# Patient Record
Sex: Female | Born: 1935 | Race: White | Hispanic: No | Marital: Married | Smoking: Never smoker
Health system: Southern US, Community
[De-identification: ages and names within clinical notes are randomized; demographics above are authoritative.]

## PROBLEM LIST (undated history)

## (undated) DIAGNOSIS — K219 Gastro-esophageal reflux disease without esophagitis: Secondary | ICD-10-CM

## (undated) DIAGNOSIS — M1990 Unspecified osteoarthritis, unspecified site: Secondary | ICD-10-CM

## (undated) DIAGNOSIS — K635 Polyp of colon: Secondary | ICD-10-CM

## (undated) DIAGNOSIS — K2270 Barrett's esophagus without dysplasia: Secondary | ICD-10-CM

## (undated) DIAGNOSIS — K5792 Diverticulitis of intestine, part unspecified, without perforation or abscess without bleeding: Secondary | ICD-10-CM

## (undated) DIAGNOSIS — N83209 Unspecified ovarian cyst, unspecified side: Secondary | ICD-10-CM

## (undated) DIAGNOSIS — F32A Depression, unspecified: Secondary | ICD-10-CM

## (undated) DIAGNOSIS — K5790 Diverticulosis of intestine, part unspecified, without perforation or abscess without bleeding: Secondary | ICD-10-CM

## (undated) DIAGNOSIS — I272 Pulmonary hypertension, unspecified: Secondary | ICD-10-CM

## (undated) DIAGNOSIS — K760 Fatty (change of) liver, not elsewhere classified: Secondary | ICD-10-CM

## (undated) DIAGNOSIS — E785 Hyperlipidemia, unspecified: Secondary | ICD-10-CM

## (undated) DIAGNOSIS — I10 Essential (primary) hypertension: Secondary | ICD-10-CM

## (undated) DIAGNOSIS — F419 Anxiety disorder, unspecified: Secondary | ICD-10-CM

## (undated) DIAGNOSIS — F329 Major depressive disorder, single episode, unspecified: Secondary | ICD-10-CM

## (undated) DIAGNOSIS — K449 Diaphragmatic hernia without obstruction or gangrene: Secondary | ICD-10-CM

## (undated) HISTORY — DX: Diaphragmatic hernia without obstruction or gangrene: K44.9

## (undated) HISTORY — DX: Fatty (change of) liver, not elsewhere classified: K76.0

## (undated) HISTORY — DX: Unspecified ovarian cyst, unspecified side: N83.209

## (undated) HISTORY — DX: Diverticulitis of intestine, part unspecified, without perforation or abscess without bleeding: K57.92

## (undated) HISTORY — DX: Pulmonary hypertension, unspecified: I27.20

## (undated) HISTORY — DX: Depression, unspecified: F32.A

## (undated) HISTORY — DX: Major depressive disorder, single episode, unspecified: F32.9

## (undated) HISTORY — DX: Barrett's esophagus without dysplasia: K22.70

## (undated) HISTORY — DX: Polyp of colon: K63.5

## (undated) HISTORY — PX: BREAST SURGERY: SHX581

## (undated) HISTORY — DX: Diverticulosis of intestine, part unspecified, without perforation or abscess without bleeding: K57.90

---

## 1988-08-27 HISTORY — PX: CARPAL TUNNEL RELEASE: SHX101

## 1999-02-16 ENCOUNTER — Other Ambulatory Visit: Admission: RE | Admit: 1999-02-16 | Discharge: 1999-02-16 | Payer: Self-pay | Admitting: *Deleted

## 2000-07-23 ENCOUNTER — Other Ambulatory Visit: Admission: RE | Admit: 2000-07-23 | Discharge: 2000-07-23 | Payer: Self-pay | Admitting: *Deleted

## 2001-11-19 ENCOUNTER — Other Ambulatory Visit: Admission: RE | Admit: 2001-11-19 | Discharge: 2001-11-19 | Payer: Self-pay | Admitting: *Deleted

## 2007-01-30 ENCOUNTER — Other Ambulatory Visit: Admission: RE | Admit: 2007-01-30 | Discharge: 2007-01-30 | Payer: Self-pay | Admitting: *Deleted

## 2008-12-31 ENCOUNTER — Encounter: Admission: RE | Admit: 2008-12-31 | Discharge: 2008-12-31 | Payer: Self-pay | Admitting: Family Medicine

## 2009-01-27 ENCOUNTER — Encounter: Admission: RE | Admit: 2009-01-27 | Discharge: 2009-01-27 | Payer: Self-pay | Admitting: Chiropractic Medicine

## 2010-03-21 ENCOUNTER — Encounter: Admission: RE | Admit: 2010-03-21 | Discharge: 2010-03-21 | Payer: Self-pay | Admitting: Orthopedic Surgery

## 2010-03-23 ENCOUNTER — Ambulatory Visit (HOSPITAL_BASED_OUTPATIENT_CLINIC_OR_DEPARTMENT_OTHER): Admission: RE | Admit: 2010-03-23 | Discharge: 2010-03-23 | Payer: Self-pay | Admitting: Orthopedic Surgery

## 2010-06-16 ENCOUNTER — Encounter: Payer: Self-pay | Admitting: Pulmonary Disease

## 2010-07-12 ENCOUNTER — Encounter: Payer: Self-pay | Admitting: Pulmonary Disease

## 2010-07-28 DIAGNOSIS — I10 Essential (primary) hypertension: Secondary | ICD-10-CM | POA: Insufficient documentation

## 2010-07-28 DIAGNOSIS — K219 Gastro-esophageal reflux disease without esophagitis: Secondary | ICD-10-CM | POA: Insufficient documentation

## 2010-07-28 DIAGNOSIS — E785 Hyperlipidemia, unspecified: Secondary | ICD-10-CM | POA: Insufficient documentation

## 2010-07-28 DIAGNOSIS — I517 Cardiomegaly: Secondary | ICD-10-CM | POA: Insufficient documentation

## 2010-07-28 DIAGNOSIS — F329 Major depressive disorder, single episode, unspecified: Secondary | ICD-10-CM | POA: Insufficient documentation

## 2010-07-31 ENCOUNTER — Ambulatory Visit: Payer: Self-pay | Admitting: Pulmonary Disease

## 2010-07-31 DIAGNOSIS — I2789 Other specified pulmonary heart diseases: Secondary | ICD-10-CM | POA: Insufficient documentation

## 2010-09-14 ENCOUNTER — Other Ambulatory Visit: Payer: Self-pay | Admitting: Internal Medicine

## 2010-09-14 ENCOUNTER — Ambulatory Visit
Admission: RE | Admit: 2010-09-14 | Discharge: 2010-09-14 | Payer: Self-pay | Source: Home / Self Care | Attending: Internal Medicine | Admitting: Internal Medicine

## 2010-09-14 ENCOUNTER — Encounter: Payer: Self-pay | Admitting: Internal Medicine

## 2010-09-15 LAB — BASIC METABOLIC PANEL
BUN: 23 mg/dL (ref 6–23)
CO2: 28 mEq/L (ref 19–32)
Calcium: 9.2 mg/dL (ref 8.4–10.5)
Chloride: 103 mEq/L (ref 96–112)
Creatinine, Ser: 0.9 mg/dL (ref 0.4–1.2)
GFR: 65.65 mL/min (ref >60.00)
Glucose, Bld: 94 mg/dL (ref 70–99)
Potassium: 3.7 mEq/L (ref 3.5–5.1)
Sodium: 140 mEq/L (ref 135–145)

## 2010-09-15 LAB — CBC WITH DIFFERENTIAL/PLATELET
Basophils Absolute: 0 10*3/uL (ref 0.0–0.1)
Basophils Relative: 0.4 % (ref 0.0–3.0)
Eosinophils Absolute: 0.1 10*3/uL (ref 0.0–0.7)
Eosinophils Relative: 2 % (ref 0.0–5.0)
HCT: 38.5 % (ref 36.0–46.0)
Hemoglobin: 13.5 g/dL (ref 12.0–15.0)
Lymphocytes Relative: 41.1 % (ref 12.0–46.0)
Lymphs Abs: 2.8 10*3/uL (ref 0.7–4.0)
MCHC: 35 g/dL (ref 30.0–36.0)
MCV: 90 fl (ref 78.0–100.0)
Monocytes Absolute: 0.5 10*3/uL (ref 0.1–1.0)
Monocytes Relative: 6.8 % (ref 3.0–12.0)
Neutro Abs: 3.3 10*3/uL (ref 1.4–7.7)
Neutrophils Relative %: 49.7 % (ref 43.0–77.0)
Platelets: 212 10*3/uL (ref 150.0–400.0)
RBC: 4.28 Mil/uL (ref 3.87–5.11)
RDW: 13.3 % (ref 11.5–14.6)
WBC: 6.7 10*3/uL (ref 4.5–10.5)

## 2010-09-15 LAB — PROTIME-INR
INR: 1.1 ratio — ABNORMAL HIGH (ref 0.8–1.0)
Prothrombin Time: 12.2 s (ref 10.2–12.4)

## 2010-09-22 ENCOUNTER — Ambulatory Visit
Admission: RE | Admit: 2010-09-22 | Payer: Self-pay | Source: Home / Self Care | Attending: Internal Medicine | Admitting: Internal Medicine

## 2010-09-22 LAB — POCT I-STAT 3, VENOUS BLOOD GAS (G3P V)
Acid-Base Excess: 2 mmol/L (ref 0.0–2.0)
Bicarbonate: 22.9 mEq/L (ref 20.0–24.0)
Bicarbonate: 25.7 mEq/L — ABNORMAL HIGH (ref 20.0–24.0)
Bicarbonate: 27.9 mEq/L — ABNORMAL HIGH (ref 20.0–24.0)
O2 Saturation: 63 %
O2 Saturation: 84 %
TCO2: 24 mmol/L (ref 0–100)
TCO2: 27 mmol/L (ref 0–100)
TCO2: 29 mmol/L (ref 0–100)
pCO2, Ven: 37 mmHg — ABNORMAL LOW (ref 45.0–50.0)
pCO2, Ven: 46.3 mmHg (ref 45.0–50.0)
pH, Ven: 7.351 — ABNORMAL HIGH (ref 7.250–7.300)
pH, Ven: 7.362 — ABNORMAL HIGH (ref 7.250–7.300)
pO2, Ven: 35 mmHg (ref 30.0–45.0)

## 2010-09-22 LAB — POCT I-STAT 3, ART BLOOD GAS (G3+)
O2 Saturation: 97 %
TCO2: 29 mmol/L (ref 0–100)
pCO2 arterial: 39.1 mmHg (ref 35.0–45.0)
pH, Arterial: 7.456 — ABNORMAL HIGH (ref 7.350–7.400)
pO2, Arterial: 90 mmHg (ref 80.0–100.0)

## 2010-09-28 NOTE — Assessment & Plan Note (Signed)
Summary: np6/pulm htn-mb   Visit Type:  Initial Consult Referring Provider:  Adrian Prince MD Primary Provider:  Adrian Prince MD  CC:  no complaints.  History of Present Illness: Sandy [name] is a 77y/o female with a h/o obesity, HTN, depression and HL referrred by Dr. Shelle Iron for right heart cath due to pulmonary HTN found by ECHO.   She recently transferred her care to Dr. Evlyn Kanner and underwent echo in Oct of 2011. Per the report her LV function was normal at 65 with mild LVH. "Mild" diastolic dysfunction. LA 3.9 . RV normal size and fx. Normal RA. Mod TR with pRSVP consistent with mild to moderate pHTN.  Saw Dr. Thornton Dales recently who did not see any clear etiology for pHTN and felt it may be primarily L-sided. Referred here for consideration for RHC.   She feels as if she is doing fairly well.  Very active 19 grandkids. She denies exertional dyspnea. Says she can walk multiple blocks at a moderate pace and go up steps without getting sob.  She denies any signifcant LE edema unless she stands on her feet all day.  She has no known cardiac issues except a h/o rheumatic fever at age 59. Never had a stress test.  No h/o thrombo-embolic disease.  No family h/o PAH. She has no symptoms of autoimmune disease except arthritic changes in her hands.  Has never use anorexogens. She does snore, but denies other symptoms c/w sleep disordered breathing.  She has never smoked.  She has had a recent cxr with no acute process. No syncope.   Preventive Screening-Counseling & Management  Caffeine-Diet-Exercise     Does Patient Exercise: no      Drug Use:  no.    Problems Prior to Update: 1)  Hypertension, Pulmonary  (ICD-416.8) 2)  G E R D  (ICD-530.81) 3)  Ventricular Hypertrophy, Left  (ICD-429.3) 4)  Hypertension  (ICD-401.9) 5)  Hyperlipidemia  (ICD-272.4) 6)  Depression  (ICD-311)  Medications Prior to Update: 1)  Lexapro 10 Mg Tabs (Escitalopram Oxalate) .... Once Daily 2)   Hydrochlorothiazide 25 Mg Tabs (Hydrochlorothiazide) .... Once Daily 3)  Klonopin 0.5 Mg Tabs (Clonazepam) .Marland Kitchen.. 1 Two Times A Day 4)  Pravastatin Sodium 20 Mg Tabs (Pravastatin Sodium) .... Once Daily  Current Medications (verified): 1)  Lexapro 10 Mg Tabs (Escitalopram Oxalate) .... Once Daily 2)  Hydrochlorothiazide 25 Mg Tabs (Hydrochlorothiazide) .... Once Daily 3)  Klonopin 0.5 Mg Tabs (Clonazepam) .Marland Kitchen.. 1 Two Times A Day 4)  Pravastatin Sodium 20 Mg Tabs (Pravastatin Sodium) .... Once Daily  Allergies (verified): No Known Drug Allergies  Past History:  Past Medical History: Last updated: 09/13/2010 1. h/o rheumatic fever age 31 2. HTN 3. Hyperlipidemia 4. Depression  5. G E R D with hiatal hernia on cxr. 6. 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  Alcohol Use - no Regular Exercise - no Drug Use - no  Risk Factors: Exercise: no (09/14/2010)  Risk Factors: Smoking Status: never (07/31/2010)  Family History: Reviewed history from 07/31/2010 and no changes required. CAD--father liver cancer--mother, mgf  Social History: Reviewed history from 07/31/2010 and no changes required. 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  Alcohol Use - no Regular Exercise - no Drug Use - no Does Patient Exercise:  no Drug Use:  no  Review  of Systems       As per HPI and past medical history; otherwise all systems negative.   Vital Signs:  Patient profile:   77 year old female Height:      65 inches Weight:      171 pounds BMI:     28.56 Pulse rate:   78 / minute BP sitting:   142 / 70  (left arm) Cuff size:   regular  Vitals Entered By: Hardin Negus, RMA (September 14, 2010 2:48 PM)  Physical Exam  General:  Gen:  well appearing. no resp difficulty HEENT: normal Neck: supple. no JVD. Carotids 2+ bilat; no bruits. No lymphadenopathy or thryomegaly appreciated. Cor: PMI nondisplaced. Regular rate & rhythm. No rubs, gallops. 2/6 TR Lungs: clear Abdomen: soft, nontender, nondistended. No hepatosplenomegaly. No bruits or masses. Good bowel sounds. Extremities: no cyanosis, clubbing, rash, edema Neuro: alert & orientedx3, cranial nerves grossly intact. moves all 4 extremities w/o difficulty. affect pleasant    Impression & Recommendations:  Problem # 1:  HYPERTENSION, PULMONARY (ICD-416.8) I agree with Dr. Shelle Iron that this is likely pulmonary venous HTN (due to diastolic dysfunction)and not primary PAH. Extensive discussion with her and her husband about mechanisms of pHTN and difference between L and R sided pHTN as well as the fact that echo esitmates of pulmonary pressures can be inaccurate. We also discussed risks andindiactions of RHC. They would like to proceed. WIll plan RHC next week.   Other Orders: EKG w/ Interpretation (93000) Cardiac Catheterization (Cardiac Cath) TLB-BMP (Basic Metabolic Panel-BMET) (80048-METABOL) TLB-CBC Platelet - w/Differential (85025-CBCD) TLB-PT (Protime) (85610-PTP)  Patient Instructions: 1)  Labs today 2)  You are scheduled for a cath on Fri 1/27 please see instruction sheet.

## 2010-09-28 NOTE — Assessment & Plan Note (Signed)
Summary: consult for possible pulmonary htn.   Visit Type:  Initial Consult Copy to:  Adrian Prince MD Primary Provider/Referring Provider:  Adrian Prince MD  CC:  pulmonary consult. pt is being evaluated for pulmonary htn. .  History of Present Illness: The pt is a 77y/o female who I have been asked to see for pulmonary htn.  She has had a recent echo that showed LVH with probable diastolic dysfunction, nl EF, and RV pressure estimated at 53mm.  The pt currently does not feel she has a breathing issue.  She denies sob with housework, bringing groceries in from the car, and feels she can walk multiple blocks at a moderate pace without getting sob.  She denies any LE edema unless she stands on her feet all day.  She has no known cardiac issues except a h/o rheumatic fever at age 27.  She has no symptoms of autoimmune disease except arthritic changes in her hands.  She does snore, but denies other symptoms c/w sleep disordered breathing.  She has never smoked.  She has had a recent cxr with no acute process.  Preventive Screening-Counseling & Management  Alcohol-Tobacco     Smoking Status: never  Current Medications (verified): 1)  Lexapro 10 Mg Tabs (Escitalopram Oxalate) .... Once Daily 2)  Hydrochlorothiazide 25 Mg Tabs (Hydrochlorothiazide) .... Once Daily 3)  Klonopin 0.5 Mg Tabs (Clonazepam) .Marland Kitchen.. 1 Two Times A Day 4)  Pravastatin Sodium 20 Mg Tabs (Pravastatin Sodium) .... Once Daily  Allergies (verified): No Known Drug Allergies  Past History:  Past Medical History:  h/o rheumatic fever age 31 HYPERTENSION (ICD-401.9) HYPERLIPIDEMIA (ICD-272.4) DEPRESSION (ICD-311)  G E R D with hiatal hernia on cxr.  Past Surgical History: Reviewed history from 07/28/2010 and no changes required. release of right transverse carpal ligament breast cysts  Family History: Reviewed history from 07/28/2010 and no changes required. 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  Smoking Status:  never  Review of Systems       The patient complains of acid heartburn.  The patient denies shortness of breath with activity, shortness of breath at rest, productive cough, non-productive cough, coughing up blood, chest pain, irregular heartbeats, indigestion, loss of appetite, weight change, abdominal pain, difficulty swallowing, sore throat, tooth/dental problems, headaches, nasal congestion/difficulty breathing through nose, sneezing, itching, ear ache, anxiety, depression, hand/feet swelling, joint stiffness or pain, rash, change in color of mucus, and fever.    Vital Signs:  Patient profile:   77 year old female Height:      65 inches Weight:      175.13 pounds BMI:     29.25 O2 Sat:      95 % on Room air Temp:     98.3 degrees F oral Pulse rate:   80 / minute BP sitting:   132 / 76  (left arm) Cuff size:   large  Vitals Entered By: Carver Fila (July 31, 2010 2:04 PM)  O2 Flow:  Room air CC: pulmonary consult. pt is being evaluated for pulmonary htn.  Comments meds and allergies updated Phone number updated Carver Fila  July 31, 2010 2:04 PM    Physical Exam  General:  obese female in nad Eyes:  PERRLA and EOMI.   Nose:  patent without discharge. Mouth:  clear with no exudates. no significant enlargement of uvula and palate. Neck:  no jvd, tmg, LN Lungs:  clear to auscultation  Heart:  rrr, 3/6 sem Abdomen:  soft and nontender, bs+ Extremities:  no edema or cyanosis  pulses intact distally Neurologic:  alert and oriented, moves all 4.   Impression & Recommendations:  Problem # 1:  HYPERTENSION, PULMONARY (ICD-416.8) Assessment New the pt has been found to have moderate pulmonary htn by estimated pressures on echo.  She does not have any significant history for doe, worsening LE edema, or autoimmune disease.  It is unclear whether this is  accurate, and therefore I have recommended a right heart cath to measure pressures and to pinpoint where the problem may be in the cardiopulmonary circuit.  If she has diastolic dysfunction, may benefit from a change in meds for her hypertension.  If she truly does have pulmonary arterial htn, she will need a complete workup to find the etiology.  The pt is agreeable to this approach  Medications Added to Medication List This Visit: 1)  Lexapro 10 Mg Tabs (Escitalopram oxalate) .... Once daily 2)  Hydrochlorothiazide 25 Mg Tabs (Hydrochlorothiazide) .... Once daily 3)  Klonopin 0.5 Mg Tabs (Clonazepam) .Marland Kitchen.. 1 two times a day 4)  Pravastatin Sodium 20 Mg Tabs (Pravastatin sodium) .... Once daily  Other Orders: Consultation Level IV (16109) Cardiology Referral (Cardiology)  Patient Instructions: 1)  will refer you to Dr. Gala Romney for right heart cath after the holidays. 2)  will arrange followup once the results are available.   Immunization History:  Influenza Immunization History:    Influenza:  historical (05/27/2010)  Pneumovax Immunization History:    Pneumovax:  historical (05/27/2000)   Appended Document: consult for possible pulmonary htn. please check and make sure this pt got her cardiology apptm...thanks.

## 2010-09-28 NOTE — Letter (Signed)
Summary: Cardiac Catheterization Instructions- [facility]  [facility], Main Office  [address]   [place], [place] [address]   Phone: [phone]  Fax: [phone]     09/14/2010 MRN: [id number]  [facility] [address] [place], [place]  [address]  Dear Ms. COYNE,   You are scheduled for a Cardiac Catheterization on Friday 09/22/10 with Dr. Gala Romney  Please arrive to the 1st floor of the Heart and Vascular Center at [facility] at 8:30 am / pm on the day of your procedure. Please do not arrive before 6:30 a.m. Call the Heart and Vascular Center at [phone] if you are unable to make your appointmnet. The Code to get into the parking garage under the building is 0030. Take the elevators to the 1st floor. You must have someone to drive you home. Someone must be with you for the first 24 hours after you arrive home. Please wear clothes that are easy to get on and off and wear slip-on shoes. Do not eat or drink after midnight except water with your medications that morning. Bring all your medications and current insurance cards with you.  _X__ DO NOT take these medications before your procedure: ________Hydrochlorothiazide________________________________________________________  ___ Make sure you take your aspirin.  ___ You may take ALL of your medications with water that morning. ________________________________________________________________________________________________________________________________  ___ DO NOT take ANY medications before your procedure.  ___ Pre-med instructions:  ________________________________________________________________________________________________________________________________  The usual length of stay after your procedure is 2 to 3 hours. This can vary.  If you have any questions, please call the office at the number listed above.   Meredith Staggers, RN

## 2010-10-06 ENCOUNTER — Encounter: Payer: Self-pay | Admitting: Physician Assistant

## 2010-10-06 ENCOUNTER — Encounter (INDEPENDENT_AMBULATORY_CARE_PROVIDER_SITE_OTHER): Payer: Medicare Other | Admitting: Physician Assistant

## 2010-10-06 DIAGNOSIS — I279 Pulmonary heart disease, unspecified: Secondary | ICD-10-CM

## 2010-10-12 NOTE — Assessment & Plan Note (Addendum)
Summary: 2 week eph/kim cath lab/mt   Referring Provider:  Adrian Prince MD Primary Provider:  Adrian Prince MD   History of Present Illness: Primary Cardiologist:  Dr. Arvilla Meres  Sandy [name] is a 77 yo female with a h/o HLP and HTN who recently had pulmonary HTN demonstrated on an echo.  She saw Dr. Gala Romney  and was set up for right heart cath.  Her On January 27 demonstrated minimally elevated right-sided pressures.  Her PCWP mean was 12, RA mean 6, RV 41/5, EDP 9, PAP 34/12 with a mean of 21; CO 3.7, CI 2.0.  She presents for post followup.  She is doing well.  She denies any groin pain.  She denies chest pain, shortness of breath or syncope.  Current Medications (verified): 1)  Lexapro 10 Mg Tabs (Escitalopram Oxalate) .... Once Daily 2)  Hydrochlorothiazide 25 Mg Tabs (Hydrochlorothiazide) .... Once Daily 3)  Klonopin 0.5 Mg Tabs (Clonazepam) .Marland Kitchen.. 1 Two Times A Day 4)  Pravastatin Sodium 20 Mg Tabs (Pravastatin Sodium) .... Once Daily  Allergies (verified): No Known Drug Allergies  Past History:  Past Medical History: Last updated: 09/13/2010 1. h/o rheumatic fever age 82 2. HTN 3. Hyperlipidemia 4. Depression  5. G E R D with hiatal hernia on cxr. 6. Pulmonary HTN  Vital Signs:  Patient profile:   77 year old female Height:      65 inches Weight:      170 pounds BMI:     28.39 Pulse rate:   78 / minute Resp:     16 per minute BP sitting:   128 / 78  (left arm)  Vitals Entered By: Marrion Coy, CNA (October 06, 2010 10:19 AM)  Physical Exam  General:  Well nourished, well developed, in no acute distress HEENT: normal Neck: no JVD Cardiac:  normal S1, S2; RRR; no murmur Lungs:  clear to auscultation bilaterally, no wheezing, rhonchi or rales Abd: soft, nontender, no hepatomegaly Ext: no  edema; RFV site without hematoma or bruit Skin: warm and dry Neuro:  CNs 2-12 intact, no focal abnormalities noted    Impression & Recommendations:  Problem  # 1:  HYPERTENSION, PULMONARY (ICD-416.8) As noted her right-sided pressures were minimally elevated.  This could be early sleep apnea or possibly transient diastolic dysfunction.  She's been asked to watch her salt.  If she presents with more signs and symptoms of sleep apnea, this can be worked up.  She can followup with Dr. Gala Romney as needed.  Patient Instructions: 1)  Your physician recommends that you schedule a follow-up appointment in: as needed

## 2010-10-18 NOTE — Procedures (Signed)
  Sandy [name]               ACCOUNT NO.:  [id number]  MEDICAL RECORD NO.:  [id number]           PATIENT TYPE:  LOCATION:                                 FACILITY:  PHYSICIAN:  Sandy [name], MDDATE OF BIRTH:  May 17, 1933  DATE OF PROCEDURE: DATE OF DISCHARGE:                           CARDIAC CATHETERIZATION   PRIMARY CARE PHYSICIAN:  Sandy Senior A. Evlyn Kanner, MD  PULMONOLOGIST:  Sandy Share, MD, [facility]  PATIENT IDENTIFICATION:  [name] is a delightful 77 year old woman with a history of obesity, hypertension, and hyperlipidemia.  She was found to have elevated right-sided pressures on a routine echocardiogram with estimated right ventricular systolic pressure of 53 mmHg.  She is essentially asymptomatic; however, given her abnormal echocardiogram, we decided that it is prudent to bring her in for right heart cath and further evaluate for pulmonary hypertension and the need for therapy.  DESCRIPTION OF THE PROCEDURE:  The risks and indications were explained. Consent was signed and placed in the chart.  The right groin area was prepped and draped in routine sterile fashion, and a 7-French venous sheath was placed using a modified Seldinger technique.  A standard Swan- Ganz catheter was used.  There were no apparent complications.  We did use 0.025 wire to help guide the catheter into the SVC to rule out any evidence of intracardiac shunting.  FINDINGS:  Right atrial pressure mean of 6, RV pressure 41/5 with an EDP of 9.  Pulmonary artery pressure 34/12 with a mean of 21.  Pulmonary capillary wedge pressure mean of 12.  There were no significant V-waves. Fick cardiac output 3.7.  Cardiac index 2.0.  Pulmonary vascular resistance was 3.8 Woods units.  Femoral artery saturation 97% on room air.  Pulmonary artery saturation 63% on room air.  Superior vena cava saturation 63%.  ASSESSMENT: 1. Minimally elevated right-sided pressures. 2. No evidence of intracardiac  shunt.  PLAN/DISCUSSION:  [name] has just minimally elevated right-sided pressures of unclear etiology.  This may be related to her body habitus or perhaps early sleep apnea.  I also suspect that she occasionally may bump her pulmonary pressures when she has left-sided volume overload in the setting of diastolic dysfunction.  At this point, I would not recommend any specific therapy.  Keep an eye on her volume status and can add a diuretic as needed though she currently does not appear to need this with a wedge pressure of 12. Consider workup for sleep apnea if clinically indicated.  We would consider possible repeat echocardiogram in 6 months to 1 year just to make sure the pressures are not increasing and there is no dilation of her right-sided structures.     Sandy Buckles. Sandy Thum, MD     DRB/MEDQ  D:  09/22/2010  T:  09/23/2010  Job:  [id number]  cc:   Sandy [name], M.D. Sandy Share, MD,FCCP  Electronically Signed by Sandy Meres MD on 10/18/2010 01:54:03 PM

## 2010-11-11 LAB — BASIC METABOLIC PANEL
BUN: 12 mg/dL (ref 6–23)
Calcium: 9 mg/dL (ref 8.4–10.5)
Creatinine, Ser: 0.95 mg/dL (ref 0.4–1.2)
GFR calc non Af Amer: 58 mL/min — ABNORMAL LOW (ref >60)
Glucose, Bld: 112 mg/dL — ABNORMAL HIGH (ref 70–99)
Potassium: 3.4 mEq/L — ABNORMAL LOW (ref 3.5–5.1)

## 2010-11-11 LAB — POCT HEMOGLOBIN-HEMACUE: Hemoglobin: 13.3 g/dL (ref 12.0–15.0)

## 2013-12-19 ENCOUNTER — Emergency Department (HOSPITAL_COMMUNITY)
Admission: EM | Admit: 2013-12-19 | Discharge: 2013-12-19 | Disposition: A | Discharge status: Home / Self Care | Payer: Medicare Other | Attending: Emergency Medicine | Admitting: Emergency Medicine

## 2013-12-19 ENCOUNTER — Emergency Department (HOSPITAL_COMMUNITY): Payer: Medicare Other

## 2013-12-19 ENCOUNTER — Encounter (HOSPITAL_COMMUNITY): Payer: Self-pay | Admitting: Emergency Medicine

## 2013-12-19 DIAGNOSIS — Z79899 Other long term (current) drug therapy: Secondary | ICD-10-CM | POA: Insufficient documentation

## 2013-12-19 DIAGNOSIS — I10 Essential (primary) hypertension: Secondary | ICD-10-CM | POA: Insufficient documentation

## 2013-12-19 DIAGNOSIS — K5732 Diverticulitis of large intestine without perforation or abscess without bleeding: Secondary | ICD-10-CM | POA: Insufficient documentation

## 2013-12-19 DIAGNOSIS — K5792 Diverticulitis of intestine, part unspecified, without perforation or abscess without bleeding: Secondary | ICD-10-CM

## 2013-12-19 DIAGNOSIS — E785 Hyperlipidemia, unspecified: Secondary | ICD-10-CM | POA: Insufficient documentation

## 2013-12-19 HISTORY — DX: Hyperlipidemia, unspecified: E78.5

## 2013-12-19 HISTORY — DX: Essential (primary) hypertension: I10

## 2013-12-19 LAB — URINALYSIS, ROUTINE W REFLEX MICROSCOPIC
BILIRUBIN URINE: NEGATIVE
Glucose, UA: NEGATIVE mg/dL
Hgb urine dipstick: NEGATIVE
KETONES UR: NEGATIVE mg/dL
LEUKOCYTES UA: NEGATIVE
NITRITE: NEGATIVE
PH: 6.5 (ref 5.0–8.0)
Protein, ur: NEGATIVE mg/dL
SPECIFIC GRAVITY, URINE: 1.026 (ref 1.005–1.030)
UROBILINOGEN UA: 1 mg/dL (ref 0.0–1.0)

## 2013-12-19 LAB — CBC WITH DIFFERENTIAL/PLATELET
BASOS ABS: 0 10*3/uL (ref 0.0–0.1)
BASOS PCT: 0 % (ref 0–1)
EOS ABS: 0 10*3/uL (ref 0.0–0.7)
Eosinophils Relative: 1 % (ref 0–5)
HEMATOCRIT: 37 % (ref 36.0–46.0)
Hemoglobin: 12.2 g/dL (ref 12.0–15.0)
Lymphocytes Relative: 19 % (ref 12–46)
Lymphs Abs: 1.5 10*3/uL (ref 0.7–4.0)
MCH: 29.6 pg (ref 26.0–34.0)
MCHC: 33 g/dL (ref 30.0–36.0)
MCV: 89.8 fL (ref 78.0–100.0)
MONOS PCT: 10 % (ref 3–12)
Monocytes Absolute: 0.8 10*3/uL (ref 0.1–1.0)
Neutro Abs: 5.7 10*3/uL (ref 1.7–7.7)
Neutrophils Relative %: 71 % (ref 43–77)
Platelets: 190 10*3/uL (ref 150–400)
RBC: 4.12 MIL/uL (ref 3.87–5.11)
RDW: 12.8 % (ref 11.5–15.5)
WBC: 8 10*3/uL (ref 4.0–10.5)

## 2013-12-19 LAB — BASIC METABOLIC PANEL
BUN: 13 mg/dL (ref 6–23)
CHLORIDE: 100 meq/L (ref 96–112)
CO2: 25 mEq/L (ref 19–32)
Calcium: 9.2 mg/dL (ref 8.4–10.5)
Creatinine, Ser: 0.81 mg/dL (ref 0.50–1.10)
GFR, EST AFRICAN AMERICAN: 79 mL/min — AB (ref >90)
GFR, EST NON AFRICAN AMERICAN: 68 mL/min — AB (ref >90)
Glucose, Bld: 126 mg/dL — ABNORMAL HIGH (ref 70–99)
POTASSIUM: 3.8 meq/L (ref 3.7–5.3)
Sodium: 137 mEq/L (ref 137–147)

## 2013-12-19 MED ORDER — METRONIDAZOLE 500 MG PO TABS
500.0000 mg | ORAL_TABLET | Freq: Once | ORAL | Status: AC
  Administered 2013-12-19: 500 mg via ORAL
  Filled 2013-12-19: qty 1

## 2013-12-19 MED ORDER — METRONIDAZOLE 500 MG PO TABS: 500.0000 mg | ORAL_TABLET | Freq: Two times a day (BID) | ORAL | Status: DC

## 2013-12-19 MED ORDER — CIPROFLOXACIN HCL 500 MG PO TABS: 500.0000 mg | ORAL_TABLET | Freq: Two times a day (BID) | ORAL | Status: DC

## 2013-12-19 MED ORDER — IOHEXOL 300 MG/ML  SOLN
50.0000 mL | Freq: Once | INTRAMUSCULAR | Status: AC | PRN
  Administered 2013-12-19: 50 mL via ORAL

## 2013-12-19 MED ORDER — IOHEXOL 300 MG/ML  SOLN
100.0000 mL | Freq: Once | INTRAMUSCULAR | Status: AC | PRN
  Administered 2013-12-19: 100 mL via INTRAVENOUS

## 2013-12-19 MED ORDER — OXYCODONE-ACETAMINOPHEN 5-325 MG PO TABS: 1.0000 | ORAL_TABLET | ORAL | Status: DC | PRN

## 2013-12-19 MED ORDER — CIPROFLOXACIN HCL 500 MG PO TABS
500.0000 mg | ORAL_TABLET | Freq: Once | ORAL | Status: AC
  Administered 2013-12-19: 500 mg via ORAL
  Filled 2013-12-19: qty 1

## 2013-12-19 MED ORDER — MORPHINE SULFATE 4 MG/ML IJ SOLN
4.0000 mg | Freq: Once | INTRAMUSCULAR | Status: AC
  Administered 2013-12-19: 4 mg via INTRAVENOUS
  Filled 2013-12-19: qty 1

## 2013-12-19 NOTE — ED Notes (Signed)
Patient transported to CT 

## 2013-12-19 NOTE — Discharge Instructions (Signed)
Diverticulitis °A diverticulum is a small pouch or sac on the colon. Diverticulosis is the presence of these diverticula on the colon. Diverticulitis is the irritation (inflammation) or infection of diverticula. °CAUSES  °The colon and its diverticula contain bacteria. If food particles block the tiny opening to a diverticulum, the bacteria inside can grow and cause an increase in pressure. This leads to infection and inflammation and is called diverticulitis. °SYMPTOMS  °· Abdominal pain and tenderness. Usually, the pain is located on the left side of your abdomen. However, it could be located elsewhere. °· Fever. °· Bloating. °· Feeling sick to your stomach (nausea). °· Throwing up (vomiting). °· Abnormal stools. °DIAGNOSIS  °Your caregiver will take a history and perform a physical exam. Since many things can cause abdominal pain, other tests may be necessary. Tests may include: °· Blood tests. °· Urine tests. °· X-ray of the abdomen. °· CT scan of the abdomen. °Sometimes, surgery is needed to determine if diverticulitis or other conditions are causing your symptoms. °TREATMENT  °Most of the time, you can be treated without surgery. Treatment includes: °· Resting the bowels by only having liquids for a few days. As you improve, you will need to eat a low-fiber diet. °· Intravenous (IV) fluids if you are losing body fluids (dehydrated). °· Antibiotic medicines that treat infections may be given. °· Pain and nausea medicine, if needed. °· Surgery if the inflamed diverticulum has burst. °HOME CARE INSTRUCTIONS  °· Try a clear liquid diet (broth, tea, or water for as long as directed by your caregiver). You may then gradually begin a low-fiber diet as tolerated.  °A low-fiber diet is a diet with less than 10 grams of fiber. Choose the foods below to reduce fiber in the diet: °· White breads, cereals, rice, and pasta. °· Cooked fruits and vegetables or soft fresh fruits and vegetables without the skin. °· Ground or  well-cooked tender beef, ham, veal, lamb, pork, or poultry. °· Eggs and seafood. °· After your diverticulitis symptoms have improved, your caregiver may put you on a high-fiber diet. A high-fiber diet includes 14 grams of fiber for every 1000 calories consumed. For a standard 2000 calorie diet, you would need 28 grams of fiber. Follow these diet guidelines to help you increase the fiber in your diet. It is important to slowly increase the amount fiber in your diet to avoid gas, constipation, and bloating. °· Choose whole-grain breads, cereals, pasta, and brown rice. °· Choose fresh fruits and vegetables with the skin on. Do not overcook vegetables because the more vegetables are cooked, the more fiber is lost. °· Choose more nuts, seeds, legumes, dried peas, beans, and lentils. °· Look for food products that have greater than 3 grams of fiber per serving on the Nutrition Facts label. °· Take all medicine as directed by your caregiver. °· If your caregiver has given you a follow-up appointment, it is very important that you go. Not going could result in lasting (chronic) or permanent injury, pain, and disability. If there is any problem keeping the appointment, call to reschedule. °SEEK MEDICAL CARE IF:  °· Your pain does not improve. °· You have a hard time advancing your diet beyond clear liquids. °· Your bowel movements do not return to normal. °SEEK IMMEDIATE MEDICAL CARE IF:  °· Your pain becomes worse. °· You have an oral temperature above 102° F (38.9° C), not controlled by medicine. °· You have repeated vomiting. °· You have bloody or black, tarry stools. °·   Symptoms that brought you to your caregiver become worse or are not getting better. °MAKE SURE YOU:  °· Understand these instructions. °· Will watch your condition. °· Will get help right away if you are not doing well or get worse. °Document Released: 05/23/2005 Document Revised: 11/05/2011 Document Reviewed: 09/18/2010 °[employer]® Patient Information  ©2014 [employer]. ° °

## 2013-12-19 NOTE — ED Provider Notes (Signed)
CSN: [id number]     Arrival date & time 12/19/13  2992 History   First MD Initiated Contact with Patient 12/19/13 1008     Chief Complaint  Patient presents with  . Abdominal Pain      The history is provided by the patient.   patient reports worsening left lower quadrant pain since yesterday.  She denies urinary symptoms.  No nausea or vomiting.  No diarrhea.  No prior history of similar symptoms.  No history of diverticulitis.  She 80 years old and has never had a colonoscopy.  She sees a physician regularly.  No flank pain.  Her pain is constant.  Her pain is mild to moderate in severity.  Past Medical History  Diagnosis Date  . Hypertension   . Hyperlipidemia    Past Surgical History  Procedure Laterality Date  . Breast surgery      for benign tumor   No family history on file. History  Substance Use Topics  . Smoking status: Never Smoker   . Smokeless tobacco: Not on file  . Alcohol Use: No   OB History   Grav Para Term Preterm Abortions TAB SAB Ect Mult Living                 Review of Systems  Gastrointestinal: Positive for abdominal pain.  All other systems reviewed and are negative.     Allergies  Review of patient's allergies indicates no known allergies.  Home Medications   Prior to Admission medications   Medication Sig Start Date End Date Taking? Authorizing Provider  B Complex-C (B-COMPLEX WITH VITAMIN C) tablet Take 1 tablet by mouth daily.   Yes Historical Provider, MD  clonazePAM (KLONOPIN) 0.5 MG tablet Take 0.5 mg by mouth 2 (two) times daily as needed for anxiety.   Yes Historical Provider, MD  escitalopram (LEXAPRO) 20 MG tablet Take 20 mg by mouth daily.  12/05/13  Yes Historical Provider, MD  losartan (COZAAR) 50 MG tablet Take 50 mg by mouth daily.  09/30/13  Yes Historical Provider, MD  NEXIUM 40 MG capsule Take 40 mg by mouth daily at 12 noon.  09/14/13  Yes Historical Provider, MD  pravastatin (PRAVACHOL) 40 MG tablet Take 40 mg by mouth  daily.  12/05/13  Yes Historical Provider, MD   BP 151/56  Pulse 85  Temp(Src) 98.7 F (37.1 C) (Oral)  Resp 16  SpO2 97% Physical Exam  Nursing note and vitals reviewed. Constitutional: She is oriented to person, place, and time. She appears well-developed and well-nourished. No distress.  HENT:  Head: Normocephalic and atraumatic.  Eyes: EOM are normal.  Neck: Normal range of motion.  Cardiovascular: Normal rate, regular rhythm and normal heart sounds.   Pulmonary/Chest: Effort normal and breath sounds normal.  Abdominal: Soft. She exhibits no distension.  Left lower quadrant tenderness without guarding or rebound.  Musculoskeletal: Normal range of motion.  Neurological: She is alert and oriented to person, place, and time.  Skin: Skin is warm and dry.  Psychiatric: She has a normal mood and affect. Judgment normal.    ED Course  Procedures (including critical care time) Labs Review Labs Reviewed  BASIC METABOLIC PANEL - Abnormal; Notable for the following:    Glucose, Bld 126 (*)    GFR calc non Af Amer 68 (*)    GFR calc Af Amer 79 (*)    All other components within normal limits  URINALYSIS, ROUTINE W REFLEX MICROSCOPIC - Abnormal; Notable for the following:  Color, Urine AMBER (*)    APPearance CLOUDY (*)    All other components within normal limits  CBC WITH DIFFERENTIAL    Imaging Review Ct Abdomen Pelvis W Contrast  12/19/2013   CLINICAL DATA:  Left lower quadrant pain for 1 day.  EXAM: CT ABDOMEN AND PELVIS WITH CONTRAST  TECHNIQUE: Multidetector CT imaging of the abdomen and pelvis was performed using the standard protocol following bolus administration of intravenous contrast.  CONTRAST:  73mL OMNIPAQUE IOHEXOL 300 MG/ML SOLN, 17mL OMNIPAQUE IOHEXOL 300 MG/ML SOLN  COMPARISON:  None.  FINDINGS: Small lung nodules measure 7 mm in the right middle lobe (image 2), 2-4 mm in the right lower lobe (images 2, 4, and 20), and 3-4 mm in the left lower lobe (images 7  and 13). Minimal atelectasis and scarring are noted in the lung bases. There is no pleural effusion.  A moderate-sized hiatal hernia is present. The liver is diffusely low in attenuation, compatible with mild steatosis. The gallbladder, spleen, adrenal glands, left kidney, and pancreas have an unremarkable unenhanced appearance. A 2.2 cm cyst is present in the upper pole of the right kidney.  There is sigmoid diverticulosis. There is mild-to-moderate wall thickening involving the sigmoid colon with mild pericolic inflammatory change. No intraperitoneal free air, free fluid, or fluid collection is identified. There is no evidence of bowel obstruction. Small bowel is grossly unremarkable.  Moderate atherosclerotic aortic calcification is noted. No enlarged lymph nodes are identified. Bladder is unremarkable. Uterus is grossly unremarkable. Both ovaries are identified. A 5.1 x 4.4 cm cyst arises from the right ovary. There is grade 1 anterolisthesis of L4 on L5. Advanced degenerative disc disease is present at L5-S1.  IMPRESSION: 1. Sigmoid diverticulitis.  No evidence of perforation or abscess. 2. Mild hepatic steatosis. 3. 5.1 cm right ovarian cyst. Further evaluation with pelvic ultrasound is recommended. This recommendation follows ACR consensus guidelines: White Paper of the ACR Incidental Findings Committee II on Adnexal Findings. J Am Coll Radiol [phone]. 4. Small bilateral lung nodules measuring up to 7 mm in size. If the patient is at high risk for bronchogenic carcinoma, follow-up chest CT at 3-6 months is recommended. If the patient is at low risk for bronchogenic carcinoma, follow-up chest CT at 6-12 months is recommended. This recommendation follows the consensus statement: Guidelines for Management of Small Pulmonary Nodules Detected on CT Scans: A Statement from the [place] as published in Radiology 2005; 237:395-400.   Electronically Signed   By: Logan Bores   On: 12/19/2013 12:39   I personally reviewed the imaging tests through PACS system I reviewed available ER/hospitalization records through the EMR    EKG Interpretation None      MDM   Final diagnoses:  None   Suspect diverticulitis.  Pain will be treated.  CT scan pending.   1:05 PM Improvement in symptoms.  Treated for diverticulitis.  Patient understands importance of followup regarding the pulmonary nodules as well as a right ovarian cyst and her need for additional imaging as an outpatient.  She will followup with her primary care physician regarding this.    Hoy Morn, MD 12/19/13 [phone]

## 2013-12-19 NOTE — ED Notes (Signed)
Pt from home c/o LLQ pain since yesterday. Pt denies dysuria, frequency, emesis, diarrhea. Pt is A&O and in NAD

## 2013-12-19 NOTE — ED Notes (Signed)
Pt returned from CT °

## 2013-12-30 ENCOUNTER — Encounter: Payer: Self-pay | Admitting: Gastroenterology

## 2014-01-04 ENCOUNTER — Encounter: Payer: Self-pay | Admitting: Gastroenterology

## 2014-01-04 ENCOUNTER — Ambulatory Visit (INDEPENDENT_AMBULATORY_CARE_PROVIDER_SITE_OTHER): Payer: Medicare Other | Admitting: Gastroenterology

## 2014-01-04 VITALS — BP 120/78 | HR 80 | Ht 65.0 in | Wt 168.8 lb

## 2014-01-04 DIAGNOSIS — Z1211 Encounter for screening for malignant neoplasm of colon: Secondary | ICD-10-CM

## 2014-01-04 DIAGNOSIS — K5792 Diverticulitis of intestine, part unspecified, without perforation or abscess without bleeding: Secondary | ICD-10-CM

## 2014-01-04 DIAGNOSIS — R195 Other fecal abnormalities: Secondary | ICD-10-CM

## 2014-01-04 DIAGNOSIS — K219 Gastro-esophageal reflux disease without esophagitis: Secondary | ICD-10-CM

## 2014-01-04 DIAGNOSIS — K5732 Diverticulitis of large intestine without perforation or abscess without bleeding: Secondary | ICD-10-CM

## 2014-01-04 MED ORDER — MOVIPREP 100 G PO SOLR: 1.0000 | Freq: Once | ORAL | Status: DC

## 2014-01-04 NOTE — Patient Instructions (Signed)
You have been scheduled for an endoscopy and colonoscopy with propofol with Dr. Hilarie Fredrickson. Please follow the written instructions given to you at your visit today. Please pick up your prep at the pharmacy within the next 1-3 days. If you use inhalers (even only as needed), please bring them with you on the day of your procedure. Your physician has requested that you go to www.startemmi.com and enter the access code given to you at your visit today. This web site gives a general overview about your procedure. However, you should still follow specific instructions given to you by our office regarding your preparation for the procedure.

## 2014-01-04 NOTE — Addendum Note (Signed)
Addended by: Hope Pigeon A on: 01/04/2014 02:18 PM   Modules accepted: Orders

## 2014-01-04 NOTE — Progress Notes (Addendum)
01/04/2014 Sandy [name] [id number] 08-02-1933   HISTORY OF PRESENT ILLNESS:  This is a pleasant 80 year old female who presents to our office today with her daughter at the request of her PCP, Dr. Forde Dandy, in order to schedule a colonoscopy.  She is currently being treated for an episode of diverticulitis with cipro and flagyl (finished the cipro yesterday but still has a few more days of the flagyl).  This is her first episode and was documented on CT scan of the abdomen and pelvis with contrast on 4/25 that showed the following:  "IMPRESSION:  1. Sigmoid diverticulitis. No evidence of perforation or abscess.  2. Mild hepatic steatosis.  3. 5.1 cm right ovarian cyst. Further evaluation with pelvic  ultrasound is recommended. This recommendation follows ACR consensus guidelines: White Paper of the ACR Incidental Findings Committee II on Adnexal Findings. J Am Coll Radiol [id number].  4. Small bilateral lung nodules measuring up to 7 mm in size. If the patient is at high risk for bronchogenic carcinoma, follow-up chest CT at 3-6 months is recommended. If the patient is at low risk for  bronchogenic carcinoma, follow-up chest CT at 6-12 months is recommended. This recommendation follows the consensus statement: Guidelines for Management of Small Pulmonary Nodules Detected on CT  Scans: A Statement from the [employer] as published in Radiology 2005; 237:395-400."  She started having left sided abdominal pain the day prior to the CT scan.  Some nausea but no vomiting.  No fevers.  Stools are normally regular without constipation or diarrhea, but when this began they were dark in color for a couple of days.  Apparently she was heme positive per her daughter's report, but we do not have documentation of that.  Denies seeing red blood in her stools.  Her Hgb is normal at 12.2 grams.  She has never undergone EGD or colonoscopy in the past.  She has been taking Nexium daily for several years for  GERD, which always worked well for her.  Since being diagnosed with this episode of diverticulitis she has been drinking a lot more water and has changed her diet.  This has helped with her acid reflux as well and she has only been taking the Nexium prn recently.  She was previously eating popcorn every day.      She is seeing her GYN tomorrow as well.    Past Medical History  Diagnosis Date  . Hypertension   . Hyperlipidemia    Past Surgical History  Procedure Laterality Date  . Breast surgery      for benign tumor    reports that she has never smoked. She has never used smokeless tobacco. She reports that she does not drink alcohol or use illicit drugs. Family history is unknown by patient. No Known Allergies    Outpatient Encounter Prescriptions as of 01/04/2014  Medication Sig  . B Complex-C (B-COMPLEX WITH VITAMIN C) tablet Take 1 tablet by mouth daily.  . clonazePAM (KLONOPIN) 0.5 MG tablet Take 0.5 mg by mouth 2 (two) times daily as needed for anxiety.  Marland Kitchen escitalopram (LEXAPRO) 20 MG tablet Take 20 mg by mouth daily.   Marland Kitchen losartan (COZAAR) 50 MG tablet Take 50 mg by mouth daily.   . metroNIDAZOLE (FLAGYL) 500 MG tablet Take 1 tablet (500 mg total) by mouth 2 (two) times daily.  Marland Kitchen NEXIUM 40 MG capsule Take 40 mg by mouth daily at 12 noon.   . pravastatin (PRAVACHOL) 40 MG tablet Take 40  mg by mouth daily.   . [DISCONTINUED] ciprofloxacin (CIPRO) 500 MG tablet Take 1 tablet (500 mg total) by mouth 2 (two) times daily.  . [DISCONTINUED] oxyCODONE-acetaminophen (PERCOCET/ROXICET) 5-325 MG per tablet Take 1 tablet by mouth every 4 (four) hours as needed for severe pain.     REVIEW OF SYSTEMS  : All other systems reviewed and negative except where noted in the History of Present Illness.   PHYSICAL EXAM: BP 120/78  Pulse 80  Ht 5\' 5"  (1.651 m)  Wt 168 lb 12.8 oz (76.567 kg)  BMI 28.09 kg/m2 General: Well developed white female in no acute distress Head: Normocephalic and  atraumatic Eyes:  Sclerae anicteric, conjunctiva pink. Ears: Normal auditory acuity Lungs: Clear throughout to auscultation Heart: Regular rate and rhythm Abdomen: Soft, non-distended.  Normal bowel sounds.  Non-tender. Rectal:  Deferred.  Will be done at the time of colonoscopy.  Was heme positive recently. Musculoskeletal: Symmetrical with no gross deformities  Skin: No lesions on visible extremities Extremities: No edema  Neurological: Alert oriented x 4, grossly non-focal Psychological:  Alert and cooperative. Normal mood and affect  ASSESSMENT AND PLAN: -Diverticulitis:  Will complete course of antibiotics.  Symptoms already much improved. -Heme positive stools x 1 recently (as reported by her daughter), but with reportedly dark colored stools for a couple of days as well.  Hgb is normal at 12.2 grams. -Long-standing GERD:  On Nexium 40 mg daily for several years.  Symptoms now better controlled without medication since changing her diet, however. -Screening colonoscopy  *Will schedule colonoscopy and EGD; she has never undergone endoscopic evaluation in the past.  The risks, benefits, and alternatives were discussed with the patient and she consents to proceed.  Procedures will be scheduled 6-8 weeks out to be sure that she is completely recovered from the diverticulitis.   Addendum: Reviewed and agree with initial management. Jerene Bears, MD

## 2014-01-12 ENCOUNTER — Encounter: Payer: Self-pay | Admitting: Internal Medicine

## 2014-01-22 ENCOUNTER — Encounter: Payer: Self-pay | Admitting: Endocrinology

## 2014-03-09 ENCOUNTER — Encounter: Payer: Self-pay | Admitting: Internal Medicine

## 2014-03-09 ENCOUNTER — Ambulatory Visit (AMBULATORY_SURGERY_CENTER): Payer: Medicare Other | Admitting: Internal Medicine

## 2014-03-09 VITALS — BP 141/88 | HR 62 | Temp 96.9°F | Resp 18 | Ht 65.0 in | Wt 168.0 lb

## 2014-03-09 DIAGNOSIS — Z1211 Encounter for screening for malignant neoplasm of colon: Secondary | ICD-10-CM

## 2014-03-09 DIAGNOSIS — K5730 Diverticulosis of large intestine without perforation or abscess without bleeding: Secondary | ICD-10-CM

## 2014-03-09 DIAGNOSIS — D131 Benign neoplasm of stomach: Secondary | ICD-10-CM

## 2014-03-09 DIAGNOSIS — R195 Other fecal abnormalities: Secondary | ICD-10-CM

## 2014-03-09 DIAGNOSIS — K219 Gastro-esophageal reflux disease without esophagitis: Secondary | ICD-10-CM

## 2014-03-09 DIAGNOSIS — K2270 Barrett's esophagus without dysplasia: Secondary | ICD-10-CM

## 2014-03-09 DIAGNOSIS — D126 Benign neoplasm of colon, unspecified: Secondary | ICD-10-CM

## 2014-03-09 DIAGNOSIS — K5732 Diverticulitis of large intestine without perforation or abscess without bleeding: Secondary | ICD-10-CM

## 2014-03-09 MED ORDER — SODIUM CHLORIDE 0.9 % IV SOLN: 500.0000 mL | INTRAVENOUS | Status: DC

## 2014-03-09 NOTE — Patient Instructions (Signed)
YOU HAD AN ENDOSCOPIC PROCEDURE TODAY AT THE [facility]: Refer to the procedure report that was given to you for any specific questions about what was found during the examination.  If the procedure report does not answer your questions, please call your gastroenterologist to clarify.  If you requested that your care partner not be given the details of your procedure findings, then the procedure report has been included in a sealed envelope for you to review at your convenience later.  YOU SHOULD EXPECT: Some feelings of bloating in the abdomen. Passage of more gas than usual.  Walking can help get rid of the air that was put into your GI tract during the procedure and reduce the bloating. If you had a lower endoscopy (such as a colonoscopy or flexible sigmoidoscopy) you may notice spotting of blood in your stool or on the toilet paper. If you underwent a bowel prep for your procedure, then you may not have a normal bowel movement for a few days.  DIET: Your first meal following the procedure should be a light meal and then it is ok to progress to your normal diet.  A half-sandwich or bowl of soup is an example of a good first meal.  Heavy or fried foods are harder to digest and may make you feel nauseous or bloated.  Likewise meals heavy in dairy and vegetables can cause extra gas to form and this can also increase the bloating.  Drink plenty of fluids but you should avoid alcoholic beverages for 24 hours.  ACTIVITY: Your care partner should take you home directly after the procedure.  You should plan to take it easy, moving slowly for the rest of the day.  You can resume normal activity the day after the procedure however you should NOT DRIVE or use heavy machinery for 24 hours (because of the sedation medicines used during the test).    SYMPTOMS TO REPORT IMMEDIATELY: A gastroenterologist can be reached at any hour.  During normal business hours, 8:30 AM to 5:00 PM Monday through Friday,  call [phone].  After hours and on weekends, please call the GI answering service at [phone] who will take a message and have the physician on call contact you.   Following lower endoscopy (colonoscopy or flexible sigmoidoscopy):  Excessive amounts of blood in the stool  Significant tenderness or worsening of abdominal pains  Swelling of the abdomen that is new, acute  Fever of 100F or higher  Following upper endoscopy (EGD)  Vomiting of blood or coffee ground material  New chest pain or pain under the shoulder blades  Painful or persistently difficult swallowing  New shortness of breath  Fever of 100F or higher  Black, tarry-looking stools  FOLLOW UP: If any biopsies were taken you will be contacted by phone or by letter within the next 1-3 weeks.  Call your gastroenterologist if you have not heard about the biopsies in 3 weeks.  Our staff will call the home number listed on your records the next business day following your procedure to check on you and address any questions or concerns that you may have at that time regarding the information given to you following your procedure. This is a courtesy call and so if there is no answer at the home number and we have not heard from you through the emergency physician on call, we will assume that you have returned to your regular daily activities without incident.  SIGNATURES/CONFIDENTIALITY: You and/or your care   partner have signed paperwork which will be entered into your electronic medical record.  These signatures attest to the fact that that the information above on your After Visit Summary has been reviewed and is understood.  Full responsibility of the confidentiality of this discharge information lies with you and/or your care-partner.  Recommendations Await biopsy results. Continue taking your PPI (your antiacid medicine) once daily. It's best to take 30 minutes before breakfast. High fiber diet. Given your age, you  will not need another colonoscopy for colon cancer screening or polyp surveillance.

## 2014-03-09 NOTE — Op Note (Signed)
[place]  [address] [place], [id number]   COLONOSCOPY PROCEDURE REPORT  PATIENT: Sandy [name], Sandy [name]  MR#: [id number] BIRTHDATE: Sep 24, 1932 , 80  yrs. old GENDER: Female ENDOSCOPIST: Jerene Bears, MD PROCEDURE DATE:  03/09/2014 PROCEDURE:   Colonoscopy with snare polypectomy First Screening Colonoscopy - Avg.  risk and is 50 yrs.  old or older - No.  Prior Negative Screening - Now for repeat screening. N/A  History of Adenoma - Now for follow-up colonoscopy & has been > or = to 3 yrs.  N/A  Polyps Removed Today? Yes. ASA CLASS:   Class III INDICATIONS:average risk screening, recent left-sided diverticulitis, heme-positive stool. MEDICATIONS: MAC sedation, administered by CRNA and additional propofol (Diprivan) after EGD 140mg  IV  DESCRIPTION OF PROCEDURE:   After the risks benefits and alternatives of the procedure were thoroughly explained, informed consent was obtained.  A digital rectal exam revealed external hemorrhoids.   The LB [id number]  endoscope was introduced through the anus and advanced to the cecum, which was identified by both the appendix and ileocecal valve. No adverse events experienced.   The quality of the prep was good, using MoviPrep The instrument was then slowly withdrawn as the colon was fully examined.   COLON FINDINGS: A sessile polyp measuring 5 mm in size was found in the proximal transverse colon.  A polypectomy was performed with a cold snare.  The resection was complete and the polyp tissue was completely retrieved.   Mild diverticulosis was noted in the sigmoid colon.   The colon mucosa was otherwise normal. Retroflexed views revealed internal/external hemorrhoids. The time to cecum=5 minutes 34 seconds.  Withdrawal time=9 minutes 28 seconds.  The scope was withdrawn and the procedure completed. COMPLICATIONS: There were no complications.  ENDOSCOPIC IMPRESSION: 1.   Sessile polyp measuring 5 mm in size was found  in the proximal transverse colon; polypectomy was performed with a cold snare 2.   Mild diverticulosis was noted in the sigmoid colon 3.   The colon mucosa was otherwise normal  RECOMMENDATIONS: 1.  Await pathology results 2.  High fiber diet 3.  Given your age, you will not need another colonoscopy for colon cancer screening or polyp surveillance.  These types of tests usually stop around the age 38. 4.  You will receive a letter within 1-2 weeks with the results of your biopsy as well as final recommendations.  Please call my office if you have not received a letter after 3 weeks.   eSigned:  Jerene Bears, MD 03/09/2014 9:16 AM    cc: The Patient and Reynold Bowen, MD

## 2014-03-09 NOTE — Op Note (Signed)
[place]  [address] [place], [id number]   ENDOSCOPY PROCEDURE REPORT  PATIENT: Sandy [name], Sandy [name]  MR#: [id number] BIRTHDATE: Sep 03, 1932 , 80  yrs. old GENDER: Female ENDOSCOPIST: Jerene Bears, MD REFERRED BY:  Reynold Bowen, M.D. PROCEDURE DATE:  03/09/2014 PROCEDURE:  EGD w/ biopsy ASA CLASS:     Class III INDICATIONS:  history of GERD.   Heme positive stool. MEDICATIONS: MAC sedation, administered by CRNA and propofol (Diprivan) 200mg  IV TOPICAL ANESTHETIC: none  DESCRIPTION OF PROCEDURE: After the risks benefits and alternatives of the procedure were thoroughly explained, informed consent was obtained.  The LB [id number] endoscope was introduced through the mouth and advanced to the second portion of the duodenum. Without limitations.  The instrument was slowly withdrawn as the mucosa was fully examined.     ESOPHAGUS: There was evidence of suspected Barrett's esophagus 31 cm from the incisors. Of probable Barrett's segment extends 2-3 cm to the top of the gastric folds at 33-34 cm.   Multiple biopsies were performed using cold forceps.  Sample obtained to rule out Barrett's esophagus.   A large hiatal hernia was noted, 6-7 cm.  STOMACH: Multiple smooth semi-pedunculated polyps ranging between 3-8 mm in size were found in the cardia, gastric fundus, and gastric body.  Multiple biopsies were performed from several different polyps using cold forceps as a representative sample. Sample sent for histology.   The remaining gastric mucosa was unremarkable.  DUODENUM: The duodenal mucosa showed no abnormalities in the bulb and second portion of the duodenum.  Retroflexed views revealed a hiatal hernia as described above. The scope was then withdrawn from the patient and the procedure completed.  COMPLICATIONS: There were no complications.  ENDOSCOPIC IMPRESSION: 1.   There was evidence of suspected Barrett's esophagus in the lower  esophagus (2-3 cm segment); multiple biopsies 2.   Large hiatal hernia 3.   Multiple semi-pedunculated polyps ranging between 3-8 mm in size were found in the cardia, gastric fundus, and gastric body 4.   The duodenal mucosa showed no abnormalities in the bulb and second portion of the duodenum  RECOMMENDATIONS: 1.  Await biopsy results 2.  Continue taking your PPI (antiacid medicine) once daily.  It is best to be taken 20-30 minutes prior to breakfast meal.  eSigned:  Jerene Bears, MD 03/09/2014 9:12 AM   CC:The Patient and Reynold Bowen, MD  PATIENT NAME:  [name], [name] MR#: [id number]

## 2014-03-09 NOTE — Progress Notes (Signed)
Called to room to assist during endoscopic procedure.  Patient ID and intended procedure confirmed with present staff. Received instructions for my participation in the procedure from the performing physician.  

## 2014-03-09 NOTE — Progress Notes (Signed)
A/ox3, pleased with MAC, report to RN 

## 2014-03-10 ENCOUNTER — Telehealth: Payer: Self-pay

## 2014-03-10 NOTE — Telephone Encounter (Signed)
Left message on answering machine. 

## 2014-03-15 ENCOUNTER — Encounter: Payer: Self-pay | Admitting: Internal Medicine

## 2014-12-24 IMAGING — CT CT ABD-PELV W/ CM
1 of 3 series · 13 of 32 positions shown, 18 images · IV contrast (OMNIPAQUE 300)
Comparison: None.

CLINICAL DATA: Left lower quadrant pain for 1 day.

EXAM:
CT ABDOMEN AND PELVIS WITH CONTRAST
TECHNIQUE: Multidetector CT imaging of the abdomen and pelvis was performed
using the standard protocol following bolus administration of
intravenous contrast.
CONTRAST:  50mL OMNIPAQUE IOHEXOL 300 MG/ML SOLN, 100mL OMNIPAQUE
IOHEXOL 300 MG/ML SOLN

[Series 2: abd/pel with · axial · 0.74mm/px · z∈[+1917,+2307]mm · 13 of 88 slices shown, 18 images]
[im 5/88  soft-tissue]
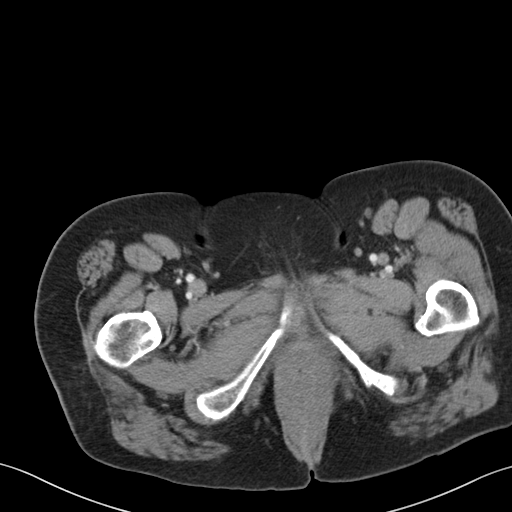
[im 5/88  bone]
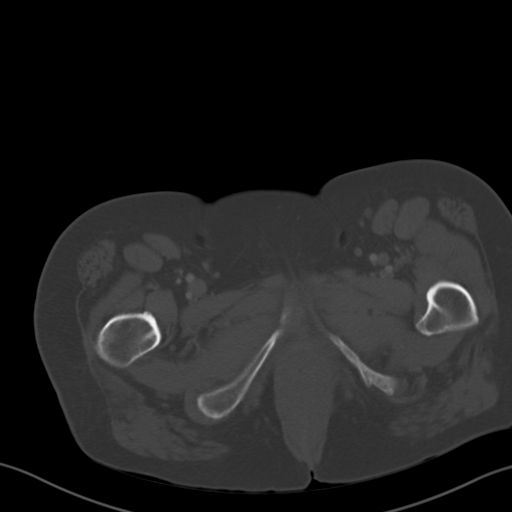
[im 14/88  soft-tissue]
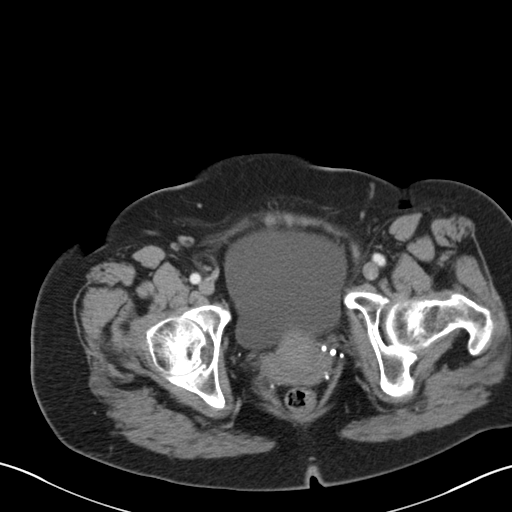
[im 19/88  soft-tissue]
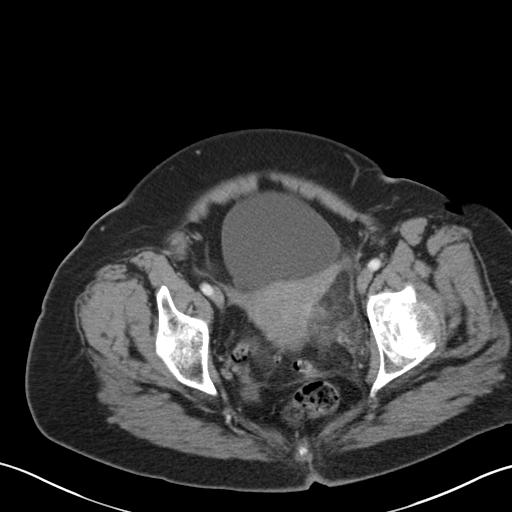
[im 28/88  soft-tissue]
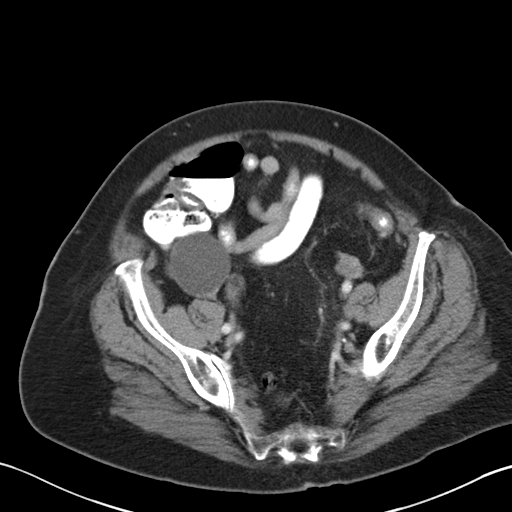
[im 33/88  soft-tissue]
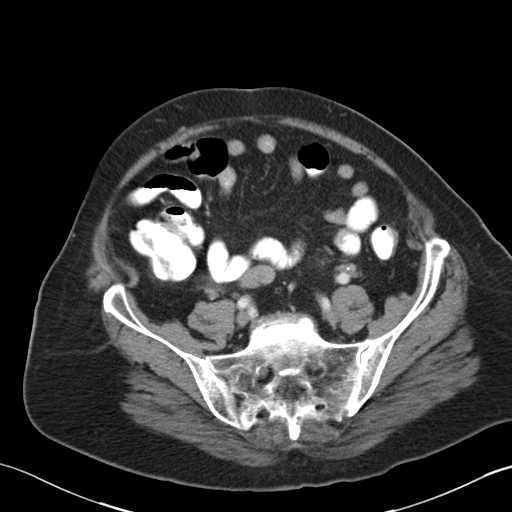
[im 42/88  soft-tissue]
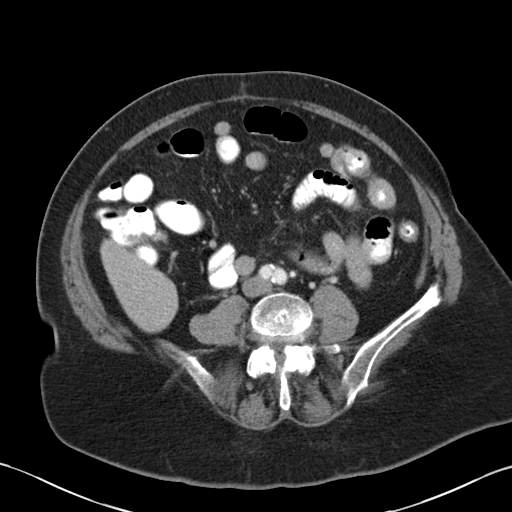
[im 46/88  soft-tissue]
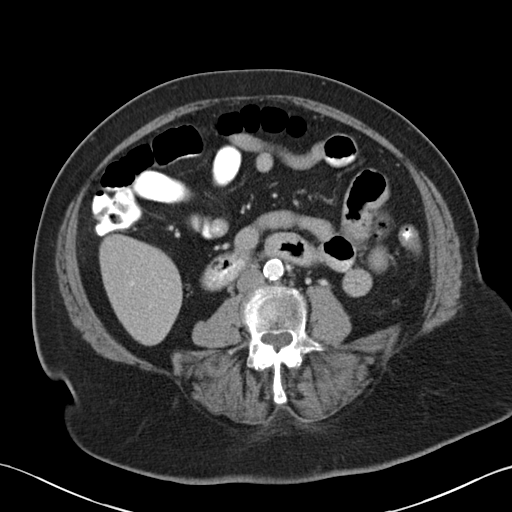
[im 55/88  soft-tissue]
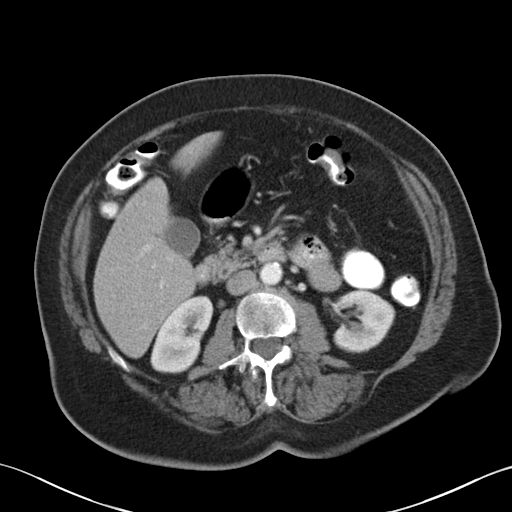
[im 60/88  soft-tissue]
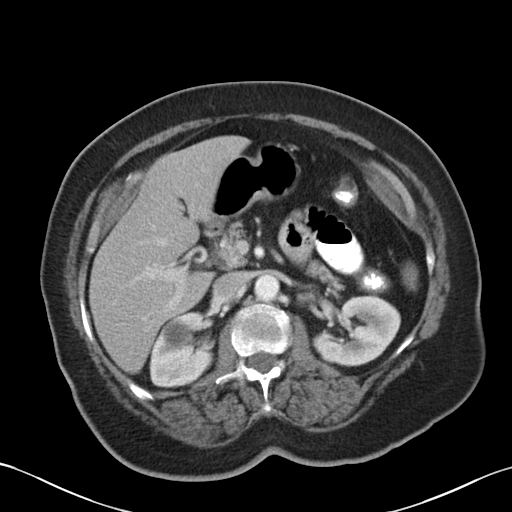
[im 60/88  bone]
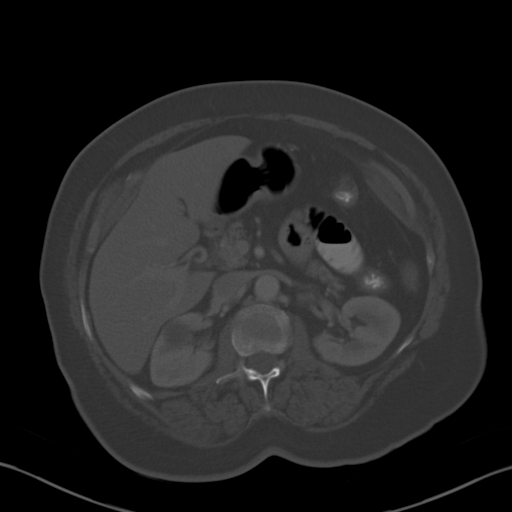
[im 69/88  soft-tissue]
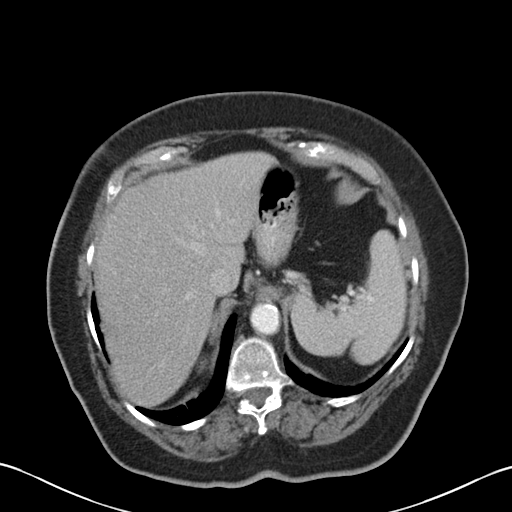
[im 69/88  lung]
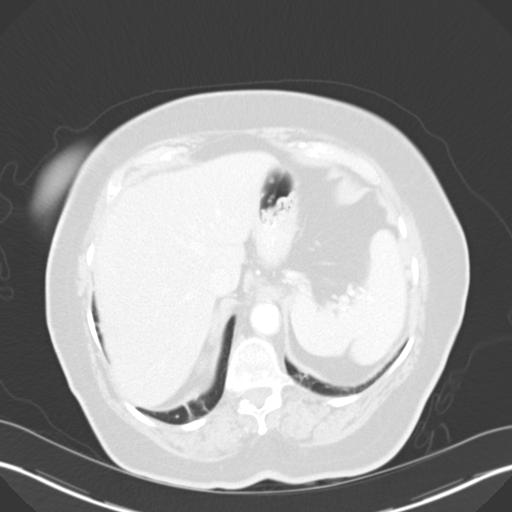
[im 74/88  soft-tissue]
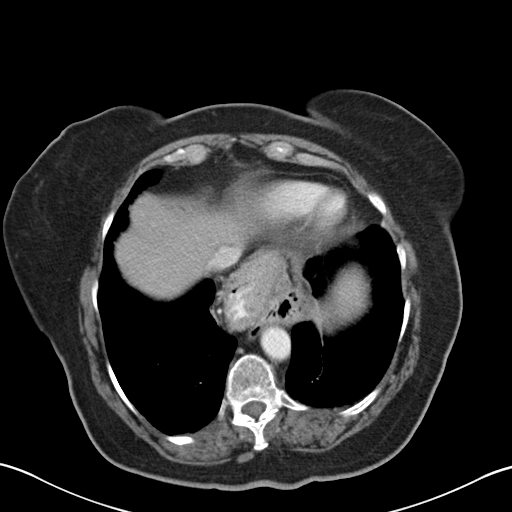
[im 74/88  lung]
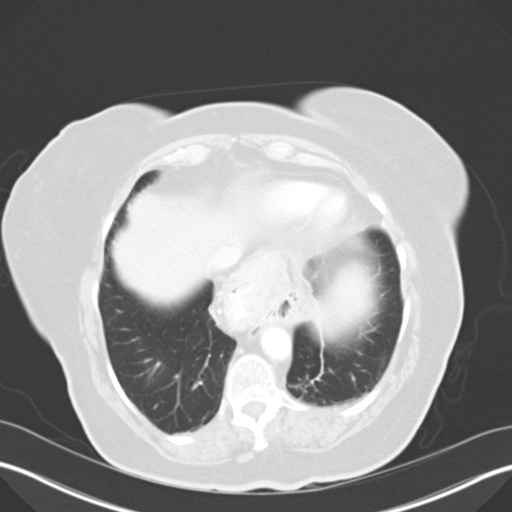
[im 78/88  lung]
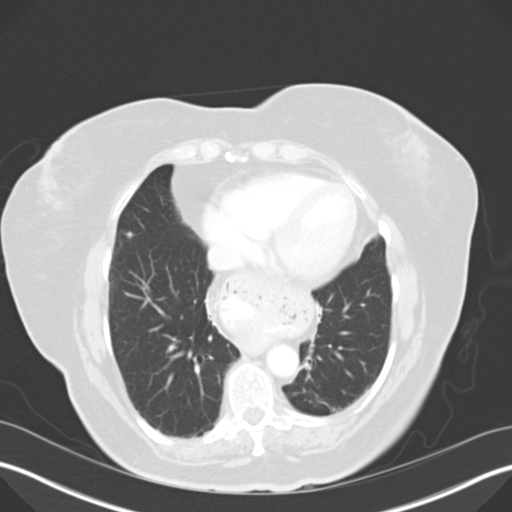
[im 83/88  soft-tissue]
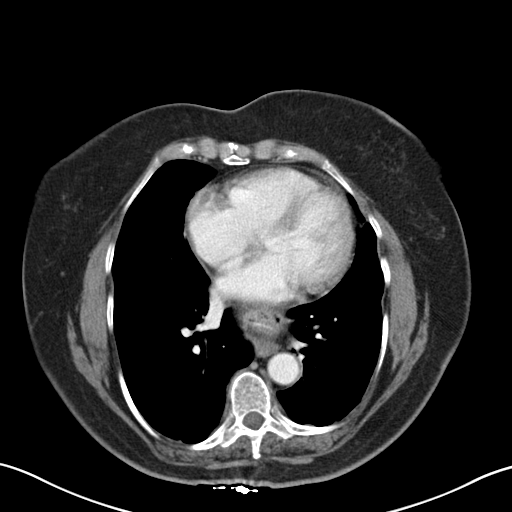
[im 83/88  lung]
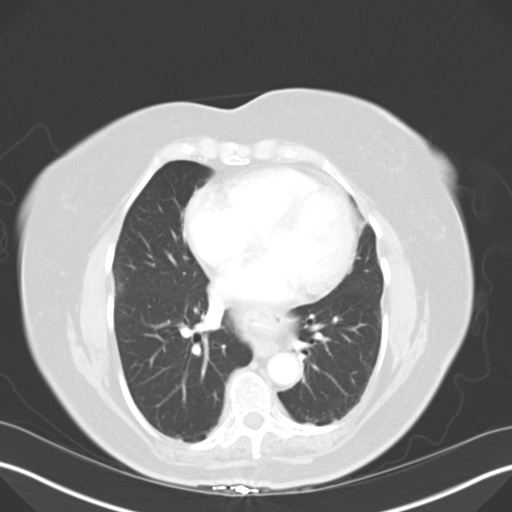

[13 of 32 positions shown; findings below may reference images not displayed]

FINDINGS: Small lung nodules measure 7 mm in the right middle lobe (image 2),
2-4 mm in the right lower lobe (images 2, 4, and 20), and 3-4 mm in
the left lower lobe (images 7 and 13). Minimal atelectasis and
scarring are noted in the lung bases. There is no pleural effusion.

A moderate-sized hiatal hernia is present. The liver is diffusely
low in attenuation, compatible with mild steatosis. The gallbladder,
spleen, adrenal glands, left kidney, and pancreas have an
unremarkable unenhanced appearance. A 2.2 cm cyst is present in the
upper pole of the right kidney.

There is sigmoid diverticulosis. There is mild-to-moderate wall
thickening involving the sigmoid colon with mild pericolic
inflammatory change. No intraperitoneal free air, free fluid, or
fluid collection is identified. There is no evidence of bowel
obstruction. Small bowel is grossly unremarkable.

Moderate atherosclerotic aortic calcification is noted. No enlarged
lymph nodes are identified. Bladder is unremarkable. Uterus is
grossly unremarkable. Both ovaries are identified. A 5.1 x 4.4 cm
cyst arises from the right ovary. There is grade 1 anterolisthesis
of L4 on L5. Advanced degenerative disc disease is present at L5-S1.
IMPRESSION: 1. Sigmoid diverticulitis.  No evidence of perforation or abscess.
2. Mild hepatic steatosis.
3. 5.1 cm right ovarian cyst. Further evaluation with pelvic
ultrasound is recommended. This recommendation follows ACR consensus
guidelines: White Paper of the ACR Incidental Findings Committee II
on Adnexal Findings. [HOSPITAL] [DATE].
4. Small bilateral lung nodules measuring up to 7 mm in size. If the
patient is at high risk for bronchogenic carcinoma, follow-up chest
CT at 3-6 months is recommended. If the patient is at low risk for
bronchogenic carcinoma, follow-up chest CT at 6-12 months is
recommended. This recommendation follows the consensus statement:
Guidelines for Management of Small Pulmonary Nodules Detected on CT
Scans: A Statement from the [HOSPITAL] as published in

## 2016-01-12 DIAGNOSIS — K2270 Barrett's esophagus without dysplasia: Secondary | ICD-10-CM | POA: Diagnosis not present

## 2016-01-12 DIAGNOSIS — E784 Other hyperlipidemia: Secondary | ICD-10-CM | POA: Diagnosis not present

## 2016-01-12 DIAGNOSIS — R5381 Other malaise: Secondary | ICD-10-CM | POA: Diagnosis not present

## 2016-01-12 DIAGNOSIS — J984 Other disorders of lung: Secondary | ICD-10-CM | POA: Diagnosis not present

## 2016-01-12 DIAGNOSIS — F418 Other specified anxiety disorders: Secondary | ICD-10-CM | POA: Diagnosis not present

## 2016-01-12 DIAGNOSIS — I10 Essential (primary) hypertension: Secondary | ICD-10-CM | POA: Diagnosis not present

## 2016-01-12 DIAGNOSIS — K760 Fatty (change of) liver, not elsewhere classified: Secondary | ICD-10-CM | POA: Diagnosis not present

## 2016-01-12 DIAGNOSIS — Z6828 Body mass index (BMI) 28.0-28.9, adult: Secondary | ICD-10-CM | POA: Diagnosis not present

## 2016-01-12 DIAGNOSIS — M2550 Pain in unspecified joint: Secondary | ICD-10-CM | POA: Diagnosis not present

## 2016-01-12 DIAGNOSIS — K635 Polyp of colon: Secondary | ICD-10-CM | POA: Diagnosis not present

## 2016-04-26 ENCOUNTER — Encounter: Payer: Self-pay | Admitting: *Deleted

## 2016-05-12 DIAGNOSIS — Z23 Encounter for immunization: Secondary | ICD-10-CM | POA: Diagnosis not present

## 2016-05-14 ENCOUNTER — Encounter: Payer: Self-pay | Admitting: Internal Medicine

## 2016-05-14 ENCOUNTER — Ambulatory Visit (INDEPENDENT_AMBULATORY_CARE_PROVIDER_SITE_OTHER): Payer: Medicare Other | Admitting: Internal Medicine

## 2016-05-14 VITALS — BP 154/92 | HR 80 | Ht 65.0 in | Wt 163.0 lb

## 2016-05-14 DIAGNOSIS — R143 Flatulence: Secondary | ICD-10-CM | POA: Diagnosis not present

## 2016-05-14 DIAGNOSIS — K2270 Barrett's esophagus without dysplasia: Secondary | ICD-10-CM | POA: Diagnosis not present

## 2016-05-14 NOTE — Progress Notes (Signed)
   Subjective:    Patient ID: Sandy [name], female    DOB: 1933-06-29, 82 y.o.   MRN: [id number]  HPI Sandy [name] is an 82 year old female with history of Barrett's esophagus, sessile serrated polyp who is here for follow-up. She is here today with her husband. She reports she is feeling well. She was diagnosed with Barrett's esophagus at the time of upper endoscopy which was performed on 03/09/2014. EGD showed short segment Barrett's without dysplasia and a large 6-7 cm hiatus hernia. She had multiple smooth some he pedunculate polyps in the proximal stomach biopsied to be fundic gland polyps. Exam is otherwise normal. She's been maintained on lansoprazole 30 mg daily and with this she's had no heartburn. She denies dysphagia and odynophagia. Denies abdominal pain. Good appetite. Bowel movements are regular without blood in her stool or melena. She had a colonoscopy on 03/09/2014 which showed a 5 mm transverse colon polyp which was removed found to be a sessile serrated polyp. There is mild sigmoid diverticulosis in the exam was otherwise normal. Her only complaint is frequent gas and at times bloating.  Review of Systems As per history of present illness, otherwise negative  Current Medications, Allergies, Past Medical History, Past Surgical History, Family History and Social History were reviewed in [employer] record.     Objective:   Physical Exam BP (!) 154/92 (BP Location: Right Arm, Patient Position: Sitting, Cuff Size: Normal)   Pulse 80   Ht 5\' 5"  (1.651 m)   Wt 163 lb (73.9 kg)   BMI 27.12 kg/m  Constitutional: Well-developed and well-nourished. No distress. HEENT: Normocephalic and atraumatic. Oropharynx is clear and moist. No oropharyngeal exudate. Conjunctivae are normal.  No scleral icterus. Neck: Neck supple. Trachea midline. Cardiovascular: Normal rate, regular rhythm and intact distal pulses. No M/R/G Pulmonary/chest: Effort normal and  breath sounds normal. No wheezing, rales or rhonchi. Abdominal: Soft, nontender, nondistended. Bowel sounds active throughout. Extremities: no clubbing, cyanosis, or edema Neurological: Alert and oriented to person place and time. Skin: Skin is warm and dry.  Psychiatric: Normal mood and affect. Behavior is normal.     Assessment & Plan:  82 year old female with history of Barrett's esophagus, sessile serrated polyp who is here for follow-up  1. Barrett's esophagus -- Short segment without dysplasia. Last endoscopy July 2018. She's been maintained on PPI and currently is without symptoms. We discussed surveillance endoscopy which normally would be done every 3 years. Due to good health and fitness, I feel that surveillance EGD is very reasonable. We will plan EGD for July 2018. Continue lansoprazole 30 mg daily.  2. Gas and flatulence -- we reviewed a low gas/low bloating diet and she was given a copy of this diet today. She can also use Beano over-the-counter per box instructions  15 minutes spent with the patient today. Greater than 50% was spent in counseling and coordination of care with the patient

## 2016-05-14 NOTE — Patient Instructions (Signed)
You will be due for a recall endoscopy in 02/2017. We will send you a reminder in the mail when it gets closer to that time.  Continue Lansoprazole 30 mg daily.  Please purchase the following medications over the counter and take as directed: Beano as per box instructions  Please follow a low gas diet. A brochure has been given to you.  If you are age 80 or older, your body mass index should be between 23-30. Your Body mass index is 27.12 kg/m. If this is out of the aforementioned range listed, please consider follow up with your Primary Care Provider.  If you are age 73 or younger, your body mass index should be between 19-25. Your Body mass index is 27.12 kg/m. If this is out of the aformentioned range listed, please consider follow up with your Primary Care Provider.

## 2016-08-15 DIAGNOSIS — N952 Postmenopausal atrophic vaginitis: Secondary | ICD-10-CM | POA: Diagnosis not present

## 2016-08-15 DIAGNOSIS — N812 Incomplete uterovaginal prolapse: Secondary | ICD-10-CM | POA: Diagnosis not present

## 2016-09-26 DIAGNOSIS — E559 Vitamin D deficiency, unspecified: Secondary | ICD-10-CM | POA: Diagnosis not present

## 2016-09-26 DIAGNOSIS — Z6828 Body mass index (BMI) 28.0-28.9, adult: Secondary | ICD-10-CM | POA: Diagnosis not present

## 2016-09-26 DIAGNOSIS — K219 Gastro-esophageal reflux disease without esophagitis: Secondary | ICD-10-CM | POA: Diagnosis not present

## 2016-09-26 DIAGNOSIS — I10 Essential (primary) hypertension: Secondary | ICD-10-CM | POA: Diagnosis not present

## 2016-09-26 DIAGNOSIS — K2270 Barrett's esophagus without dysplasia: Secondary | ICD-10-CM | POA: Diagnosis not present

## 2016-09-26 DIAGNOSIS — Z1389 Encounter for screening for other disorder: Secondary | ICD-10-CM | POA: Diagnosis not present

## 2016-09-26 DIAGNOSIS — K760 Fatty (change of) liver, not elsewhere classified: Secondary | ICD-10-CM | POA: Diagnosis not present

## 2016-09-26 DIAGNOSIS — E784 Other hyperlipidemia: Secondary | ICD-10-CM | POA: Diagnosis not present

## 2017-01-15 ENCOUNTER — Encounter: Payer: Self-pay | Admitting: Internal Medicine

## 2017-03-25 DIAGNOSIS — I10 Essential (primary) hypertension: Secondary | ICD-10-CM | POA: Diagnosis not present

## 2017-03-25 DIAGNOSIS — E784 Other hyperlipidemia: Secondary | ICD-10-CM | POA: Diagnosis not present

## 2017-03-25 DIAGNOSIS — E559 Vitamin D deficiency, unspecified: Secondary | ICD-10-CM | POA: Diagnosis not present

## 2017-03-25 DIAGNOSIS — K2270 Barrett's esophagus without dysplasia: Secondary | ICD-10-CM | POA: Diagnosis not present

## 2017-04-30 DIAGNOSIS — Z23 Encounter for immunization: Secondary | ICD-10-CM | POA: Diagnosis not present

## 2017-05-14 DIAGNOSIS — T50B95A Adverse effect of other viral vaccines, initial encounter: Secondary | ICD-10-CM | POA: Diagnosis not present

## 2017-05-14 DIAGNOSIS — M79621 Pain in right upper arm: Secondary | ICD-10-CM | POA: Diagnosis not present

## 2017-05-14 DIAGNOSIS — M79622 Pain in left upper arm: Secondary | ICD-10-CM | POA: Diagnosis not present

## 2017-05-14 DIAGNOSIS — I10 Essential (primary) hypertension: Secondary | ICD-10-CM | POA: Diagnosis not present

## 2017-09-05 DIAGNOSIS — R1032 Left lower quadrant pain: Secondary | ICD-10-CM | POA: Diagnosis not present

## 2017-09-05 DIAGNOSIS — Z6827 Body mass index (BMI) 27.0-27.9, adult: Secondary | ICD-10-CM | POA: Diagnosis not present

## 2017-09-05 DIAGNOSIS — I10 Essential (primary) hypertension: Secondary | ICD-10-CM | POA: Diagnosis not present

## 2017-09-05 DIAGNOSIS — N390 Urinary tract infection, site not specified: Secondary | ICD-10-CM | POA: Diagnosis not present

## 2017-09-05 DIAGNOSIS — R102 Pelvic and perineal pain: Secondary | ICD-10-CM | POA: Diagnosis not present

## 2017-09-17 DIAGNOSIS — E559 Vitamin D deficiency, unspecified: Secondary | ICD-10-CM | POA: Diagnosis not present

## 2017-09-17 DIAGNOSIS — I10 Essential (primary) hypertension: Secondary | ICD-10-CM | POA: Diagnosis not present

## 2017-09-17 DIAGNOSIS — R82998 Other abnormal findings in urine: Secondary | ICD-10-CM | POA: Diagnosis not present

## 2017-09-17 DIAGNOSIS — E7849 Other hyperlipidemia: Secondary | ICD-10-CM | POA: Diagnosis not present

## 2017-09-19 DIAGNOSIS — R7309 Other abnormal glucose: Secondary | ICD-10-CM | POA: Diagnosis not present

## 2017-09-24 DIAGNOSIS — K760 Fatty (change of) liver, not elsewhere classified: Secondary | ICD-10-CM | POA: Diagnosis not present

## 2017-09-24 DIAGNOSIS — Z0000 Encounter for general adult medical examination without abnormal findings: Secondary | ICD-10-CM | POA: Diagnosis not present

## 2017-09-24 DIAGNOSIS — E559 Vitamin D deficiency, unspecified: Secondary | ICD-10-CM | POA: Diagnosis not present

## 2017-09-24 DIAGNOSIS — K2270 Barrett's esophagus without dysplasia: Secondary | ICD-10-CM | POA: Diagnosis not present

## 2017-09-25 DIAGNOSIS — R05 Cough: Secondary | ICD-10-CM | POA: Diagnosis not present

## 2017-09-27 DIAGNOSIS — Z1212 Encounter for screening for malignant neoplasm of rectum: Secondary | ICD-10-CM | POA: Diagnosis not present

## 2018-03-27 DIAGNOSIS — R7301 Impaired fasting glucose: Secondary | ICD-10-CM | POA: Diagnosis not present

## 2018-03-27 DIAGNOSIS — I10 Essential (primary) hypertension: Secondary | ICD-10-CM | POA: Diagnosis not present

## 2018-03-27 DIAGNOSIS — F3289 Other specified depressive episodes: Secondary | ICD-10-CM | POA: Diagnosis not present

## 2018-03-27 DIAGNOSIS — E7849 Other hyperlipidemia: Secondary | ICD-10-CM | POA: Diagnosis not present

## 2018-05-19 DIAGNOSIS — Z23 Encounter for immunization: Secondary | ICD-10-CM | POA: Diagnosis not present

## 2018-07-23 ENCOUNTER — Ambulatory Visit
Admission: RE | Admit: 2018-07-23 | Discharge: 2018-07-23 | Disposition: A | Discharge status: Home / Self Care | Payer: Medicare Other | Source: Ambulatory Visit | Attending: Endocrinology | Admitting: Endocrinology

## 2018-07-23 ENCOUNTER — Observation Stay (HOSPITAL_COMMUNITY)
Admission: EM | Admit: 2018-07-23 | Discharge: 2018-07-28 | Disposition: A | Discharge status: Home / Self Care | Payer: Medicare Other | Attending: General Surgery | Admitting: General Surgery

## 2018-07-23 ENCOUNTER — Other Ambulatory Visit: Payer: Self-pay | Admitting: Endocrinology

## 2018-07-23 ENCOUNTER — Encounter (HOSPITAL_COMMUNITY): Payer: Self-pay | Admitting: Emergency Medicine

## 2018-07-23 ENCOUNTER — Other Ambulatory Visit: Payer: Self-pay

## 2018-07-23 DIAGNOSIS — K8000 Calculus of gallbladder with acute cholecystitis without obstruction: Principal | ICD-10-CM | POA: Insufficient documentation

## 2018-07-23 DIAGNOSIS — R1032 Left lower quadrant pain: Secondary | ICD-10-CM

## 2018-07-23 DIAGNOSIS — K82A1 Gangrene of gallbladder in cholecystitis: Secondary | ICD-10-CM | POA: Insufficient documentation

## 2018-07-23 DIAGNOSIS — N83201 Unspecified ovarian cyst, right side: Secondary | ICD-10-CM | POA: Diagnosis not present

## 2018-07-23 DIAGNOSIS — I272 Pulmonary hypertension, unspecified: Secondary | ICD-10-CM | POA: Diagnosis not present

## 2018-07-23 DIAGNOSIS — Z6826 Body mass index (BMI) 26.0-26.9, adult: Secondary | ICD-10-CM | POA: Diagnosis not present

## 2018-07-23 DIAGNOSIS — I10 Essential (primary) hypertension: Secondary | ICD-10-CM | POA: Insufficient documentation

## 2018-07-23 DIAGNOSIS — K819 Cholecystitis, unspecified: Secondary | ICD-10-CM | POA: Diagnosis not present

## 2018-07-23 DIAGNOSIS — E785 Hyperlipidemia, unspecified: Secondary | ICD-10-CM | POA: Diagnosis not present

## 2018-07-23 DIAGNOSIS — K810 Acute cholecystitis: Secondary | ICD-10-CM | POA: Diagnosis not present

## 2018-07-23 DIAGNOSIS — K219 Gastro-esophageal reflux disease without esophagitis: Secondary | ICD-10-CM | POA: Insufficient documentation

## 2018-07-23 DIAGNOSIS — R5381 Other malaise: Secondary | ICD-10-CM | POA: Diagnosis not present

## 2018-07-23 MED ORDER — MORPHINE SULFATE (PF) 2 MG/ML IV SOLN
1.0000 mg | INTRAVENOUS | Status: DC | PRN
  Administered 2018-07-23: 1 mg via INTRAVENOUS
  Filled 2018-07-23 (×2): qty 1

## 2018-07-23 MED ORDER — ONDANSETRON HCL 4 MG/2ML IJ SOLN
4.0000 mg | Freq: Four times a day (QID) | INTRAMUSCULAR | Status: DC | PRN
  Administered 2018-07-24: 4 mg via INTRAVENOUS

## 2018-07-23 MED ORDER — IBUPROFEN 400 MG PO TABS
400.0000 mg | ORAL_TABLET | Freq: Four times a day (QID) | ORAL | Status: DC | PRN
  Administered 2018-07-28 (×2): 400 mg via ORAL
  Filled 2018-07-23 (×2): qty 1

## 2018-07-23 MED ORDER — LOSARTAN POTASSIUM 50 MG PO TABS
50.0000 mg | ORAL_TABLET | Freq: Every day | ORAL | Status: DC
  Administered 2018-07-25 – 2018-07-28 (×4): 50 mg via ORAL
  Filled 2018-07-23 (×4): qty 1

## 2018-07-23 MED ORDER — OXYCODONE HCL 5 MG PO TABS
5.0000 mg | ORAL_TABLET | ORAL | Status: DC | PRN
  Administered 2018-07-23 – 2018-07-27 (×11): 5 mg via ORAL
  Filled 2018-07-23 (×12): qty 1

## 2018-07-23 MED ORDER — KCL IN DEXTROSE-NACL 20-5-0.45 MEQ/L-%-% IV SOLN
INTRAVENOUS | Status: DC
  Administered 2018-07-23 – 2018-07-27 (×8): via INTRAVENOUS
  Filled 2018-07-23 (×9): qty 1000

## 2018-07-23 MED ORDER — CLONAZEPAM 0.25 MG PO TBDP
0.5000 mg | ORAL_TABLET | Freq: Two times a day (BID) | ORAL | Status: DC | PRN
  Administered 2018-07-26: 0.5 mg via ORAL
  Filled 2018-07-23: qty 2

## 2018-07-23 MED ORDER — FENTANYL CITRATE (PF) 100 MCG/2ML IJ SOLN
50.0000 ug | Freq: Once | INTRAMUSCULAR | Status: AC
  Administered 2018-07-23: 50 ug via INTRAVENOUS
  Filled 2018-07-23: qty 2

## 2018-07-23 MED ORDER — DIPHENHYDRAMINE HCL 50 MG/ML IJ SOLN: 12.5000 mg | Freq: Four times a day (QID) | INTRAMUSCULAR | Status: DC | PRN

## 2018-07-23 MED ORDER — SODIUM CHLORIDE 0.9 % IV SOLN
2.0000 g | INTRAVENOUS | Status: DC
  Administered 2018-07-23 – 2018-07-27 (×6): 2 g via INTRAVENOUS
  Filled 2018-07-23 (×6): qty 20

## 2018-07-23 MED ORDER — IOPAMIDOL (ISOVUE-300) INJECTION 61%
100.0000 mL | Freq: Once | INTRAVENOUS | Status: AC | PRN
  Administered 2018-07-23: 100 mL via INTRAVENOUS

## 2018-07-23 MED ORDER — ACETAMINOPHEN 325 MG PO TABS
650.0000 mg | ORAL_TABLET | Freq: Three times a day (TID) | ORAL | Status: DC
  Administered 2018-07-23 – 2018-07-28 (×14): 650 mg via ORAL
  Filled 2018-07-23 (×14): qty 2

## 2018-07-23 MED ORDER — ONDANSETRON 4 MG PO TBDP
4.0000 mg | ORAL_TABLET | Freq: Four times a day (QID) | ORAL | Status: DC | PRN
  Administered 2018-07-27: 4 mg via ORAL
  Filled 2018-07-23: qty 1

## 2018-07-23 MED ORDER — HYDRALAZINE HCL 20 MG/ML IJ SOLN: 10.0000 mg | INTRAMUSCULAR | Status: DC | PRN

## 2018-07-23 MED ORDER — LACTATED RINGERS IV BOLUS
1000.0000 mL | Freq: Once | INTRAVENOUS | Status: AC
  Administered 2018-07-23: 1000 mL via INTRAVENOUS

## 2018-07-23 MED ORDER — ENOXAPARIN SODIUM 40 MG/0.4ML ~~LOC~~ SOLN
40.0000 mg | SUBCUTANEOUS | Status: DC
  Administered 2018-07-23 – 2018-07-27 (×5): 40 mg via SUBCUTANEOUS
  Filled 2018-07-23 (×5): qty 0.4

## 2018-07-23 MED ORDER — PANTOPRAZOLE SODIUM 40 MG PO TBEC
40.0000 mg | DELAYED_RELEASE_TABLET | Freq: Every day | ORAL | Status: DC
  Administered 2018-07-25 – 2018-07-27 (×3): 40 mg via ORAL
  Filled 2018-07-23 (×4): qty 1

## 2018-07-23 MED ORDER — DIPHENHYDRAMINE HCL 12.5 MG/5ML PO ELIX: 12.5000 mg | ORAL_SOLUTION | Freq: Four times a day (QID) | ORAL | Status: DC | PRN

## 2018-07-23 NOTE — ED Triage Notes (Signed)
Pt states she started having right abd pain on Monday. Pt went to primary MD today to run some tests and was told to come to ED to have gallbladder removed. Pt had a scan that revealed gallstones.

## 2018-07-23 NOTE — H&P (Addendum)
[facility] Admission Note  Sandy [name] Feb 06, 1933  [id number].    Requesting MD: Dayna Barker, MD Chief Complaint/Reason for Consult: Acute calculus cholecystitis  HPI:  Sandy [name] is an 84 year old female with a past medical history of hypertension, hyperlipidemia, hiatal hernia, GERD, and diverticulitis who presented to [facility] emergency department after an outside CT scan was significant for acute calculus cholecystitis.  Patient endorses almost 3 days of upper abdominal pain described as severe, sharp, with radiation around her right side to her back.  Associated symptoms include decreased appetite and one episode of dry heaves.  Patient denies similar pain in the past.  Has a history of acute diverticulitis requiring hospital admission about 4 years ago which resolved with IV antibiotics.  She denies a previous history of abdominal surgery.  No reported drug allergies.  Denies current use of blood thinning medications.  Her husband and 1 of her 10 children are at bedside  ROS: Review of Systems  Constitutional: Negative for chills and fever.  Respiratory: Negative for shortness of breath.   Cardiovascular: Negative for chest pain and palpitations.  Gastrointestinal: Positive for abdominal pain. Negative for blood in stool, constipation, diarrhea and nausea.  All other systems reviewed and are negative.   Family History  Problem Relation Age of Onset  . COPD Mother   . Liver cancer Mother   . Heart disease Maternal Aunt   . Heart disease Paternal Grandfather   . CAD Father     Past Medical History:  Diagnosis Date  . Barrett's esophagus   . Colon polyps   . Depression   . Diverticulitis   . Diverticulosis   . Hepatic steatosis   . Hiatal hernia   . Hyperlipidemia   . Hypertension   . Ovarian cyst   . Pulmonary hypertension ([place])     Past Surgical History:  Procedure Laterality Date  . BREAST SURGERY     for benign tumor    Social History:   reports that she has never smoked. She has never used smokeless tobacco. She reports that she does not drink alcohol or use drugs.  Allergies: No Known Allergies   (Not in a hospital admission)  Blood pressure 113/79, pulse (!) 106, temperature 98.4 F (36.9 C), temperature source Oral, resp. rate 16, height _0  (1.651 m), weight 68 kg, SpO2 96 %. Physical Exam: Physical Exam  Constitutional: She is oriented to person, place, and time. She appears well-developed and well-nourished.  Non-toxic appearance. She does not appear ill.  HENT:  Head: Normocephalic and atraumatic.  Mouth/Throat: Oropharynx is clear and moist.  Eyes: Pupils are equal, round, and reactive to light. EOM are normal. No scleral icterus.  Cardiovascular: Normal rate, regular rhythm, normal heart sounds and intact distal pulses. Exam reveals no friction rub.  No murmur heard. Pulmonary/Chest: Effort normal and breath sounds normal. No respiratory distress. She has no wheezes. She has no rhonchi. She has no rales.  Abdominal: Soft. Normal appearance, normal aorta and bowel sounds are normal. She exhibits distension (Mild). There is tenderness in the right upper quadrant. There is positive Murphy's sign. No hernia.  Neurological: She is alert and oriented to person, place, and time.  Skin: Skin is warm and dry. Capillary refill takes less than 2 seconds. No rash noted.  Psychiatric: She has a normal mood and affect. Her behavior is normal.    No results found for this or any previous visit (from the past 48 hour(s)). Ct Abdomen Pelvis W  Contrast  Result Date: 07/23/2018 CLINICAL DATA:  Left lower quadrant abdominal pain. EXAM: CT ABDOMEN AND PELVIS WITH CONTRAST TECHNIQUE: Multidetector CT imaging of the abdomen and pelvis was performed using the standard protocol following bolus administration of intravenous contrast. CONTRAST:  152m ISOVUE-300 IOPAMIDOL (ISOVUE-300) INJECTION 61% COMPARISON:  CT scan of December 19, 2013. FINDINGS: Lower chest: Large hiatal hernia is noted. Visualized lung bases are unremarkable. Hepatobiliary: No biliary dilatation is noted. Gallbladder is dilated and thickened with surrounding inflammation and pericholecystic fluid consistent with acute cholecystitis. There appears to be a large calculus in the neck of the gallbladder. Liver is unremarkable. Pancreas: Unremarkable. No pancreatic ductal dilatation or surrounding inflammatory changes. Spleen: Normal in size without focal abnormality. Adrenals/Urinary Tract: Adrenal glands are unremarkable. Stable right renal cyst is noted. No hydronephrosis or renal obstruction is noted. Urinary bladder is unremarkable. Stomach/Bowel: The stomach appears normal. The appendix appears normal. There is no evidence of bowel obstruction. There is seen wall thickening of the hepatic flexure secondary to adjacent gallbladder inflammation. Sigmoid diverticulosis is noted without inflammation. Vascular/Lymphatic: Aortic atherosclerosis. No enlarged abdominal or pelvic lymph nodes. Reproductive: Uterus and left adnexal region are unremarkable. 5 cm right ovarian cyst is noted which is not significantly changed compared to prior exam. Other: No abdominal wall hernia or abnormality. No abdominopelvic ascites. Musculoskeletal: No acute or significant osseous findings. IMPRESSION: Dilated gallbladder is noted with large stone in neck, with significant wall thickening and pericholecystic fluid, concerning for acute cholecystitis. These results will be called to the ordering clinician or representative by the Radiologist Assistant, and communication documented in the PACS or zVision Dashboard. Large sliding-type hiatal hernia is noted. Stable 5 cm right ovarian cyst. Follow-up pelvic ultrasound in 1 year is recommended. Aortic Atherosclerosis (ICD10-I70.0). Electronically Signed   By: JMarijo Conception M.D.   On: 07/23/2018 12:30   Assessment/Plan Hypertension-home meds, PRN  hydralazine Hyperlipidemia  GERD-PPI Past medical history of diverticulitis Anxiety -continue PRN Klonopin  Acute calculus cholecystitis Hyperbilirubinemia - Afebrile, white blood cell count 13,000, total bilirubin 2.2 (AST/ALT/alk phos WNL) - CT scan with a gallstone lodged in the neck of the gallbladder, positive Murphy sign on exam - Repeat CMET in the morning, if bilirubin continues to trend up we will consider GI consultation.  Otherwise recommend laparoscopic cholecystectomy with intraoperative cholangiogram. - Allow clear liquids, n.p.o. after midnight -PRN analgesics and antiemetics  FEN - IVF @ 75 mL/hrm, clears, NPO after MN ID - Rocephin 11/27 >> VTE - SCD's, lovenox Foley - none   EJill Alexanders PA-C [facility] 07/23/2018, 4:12 PM Pager: [phone]: [phone]

## 2018-07-23 NOTE — ED Notes (Signed)
Attempted report 

## 2018-07-23 NOTE — ED Notes (Signed)
Pt refusing blood work at this time- states it was already completed today at MD office and it should be available to [place].

## 2018-07-23 NOTE — ED Provider Notes (Signed)
Emergency Department Provider Note   I have reviewed the triage vital signs and the nursing notes.   HISTORY  Chief Complaint Abdominal Pain   HPI Sandy [name] is a 84 y.o. female without significant past medical history the presents the emergency department today with cholecystitis on CT scan from outside facility.  Patient states that she would have 3 to 4 days of progressively worsening right upper quadrant abdominal pain associate with some nausea but no vomiting.  Patient states that she would see her primary doctor today and labs were done as shown below.  Patient states that she was sent for CT scan and told to come to the emergency department for evaluation.  On review of her labs at the outside labs she did have a white count of 13, elevated bilirubin 2.1 but her urine, CMP, and CBC were otherwise unremarkable.  No other associated or modifying symptoms.    Past Medical History:  Diagnosis Date  . Barrett's esophagus   . Colon polyps   . Depression   . Diverticulitis   . Diverticulosis   . Hepatic steatosis   . Hiatal hernia   . Hyperlipidemia   . Hypertension   . Ovarian cyst   . Pulmonary hypertension [facility])     Patient Active Problem List   Diagnosis Date Noted  . Heme positive stool 01/04/2014  . Special screening for malignant neoplasms, colon 01/04/2014  . GERD (gastroesophageal reflux disease) 01/04/2014  . Diverticulitis 01/04/2014  . HYPERTENSION, PULMONARY 07/31/2010  . HYPERLIPIDEMIA 07/28/2010  . DEPRESSION 07/28/2010  . HYPERTENSION 07/28/2010  . VENTRICULAR HYPERTROPHY, LEFT 07/28/2010  . G E R D 07/28/2010    Past Surgical History:  Procedure Laterality Date  . BREAST SURGERY     for benign tumor    Current Outpatient Rx  . Order #: [id number] Class: Historical Med  . Order #: [id number] Class: Historical Med  . Order #: [id number] Class: Historical Med  . Order #: [id number] Class: Historical Med  . Order #: [id number] Class: Historical  Med    Allergies Patient has no known allergies.  Family History  Problem Relation Age of Onset  . COPD Mother   . Liver cancer Mother   . Heart disease Maternal Aunt   . Heart disease Paternal Grandfather   . CAD Father     Social History Social History   Tobacco Use  . Smoking status: Never Smoker  . Smokeless tobacco: Never Used  Substance Use Topics  . Alcohol use: No  . Drug use: No    Review of Systems  All other systems negative except as documented in the HPI. All pertinent positives and negatives as reviewed in the HPI. ____________________________________________   PHYSICAL EXAM:  VITAL SIGNS: Blood pressure 113/79, pulse (!) 106, temperature 98.4 F (36.9 C), temperature source Oral, resp. rate 16, height 5\' 5"  (1.651 m), weight 68 kg, SpO2 96 %.   Constitutional: Alert and oriented. Well appearing and in no acute distress. Eyes: Conjunctivae are normal. PERRL. EOMI. Head: Atraumatic. Nose: No congestion/rhinnorhea. Mouth/Throat: Mucous membranes are moist.  Oropharynx non-erythematous. Neck: No stridor.  No meningeal signs.   Cardiovascular: Normal rate, regular rhythm. Good peripheral circulation. Grossly normal heart sounds.   Respiratory: Normal respiratory effort.  No retractions. Lungs CTAB. Gastrointestinal: Soft and ttp RUQ. No distention.  Musculoskeletal: No lower extremity tenderness nor edema. No gross deformities of extremities. Neurologic:  Normal speech and language. No gross focal neurologic deficits are appreciated.  Skin:  Skin is warm, dry  and intact. No rash noted.  ____________________________________________   LABS (all labs ordered are listed, but only abnormal results are displayed)  Labs Reviewed - No data to display             ____________________________________________  EKG   EKG Interpretation  Date/Time:    Ventricular Rate:    PR Interval:    QRS Duration:   QT Interval:    QTC Calculation:   R  Axis:     Text Interpretation:        ____________________________________________  RADIOLOGY  Ct Abdomen Pelvis W Contrast  Result Date: 07/23/2018 CLINICAL DATA:  Left lower quadrant abdominal pain. EXAM: CT ABDOMEN AND PELVIS WITH CONTRAST TECHNIQUE: Multidetector CT imaging of the abdomen and pelvis was performed using the standard protocol following bolus administration of intravenous contrast. CONTRAST:  111mL ISOVUE-300 IOPAMIDOL (ISOVUE-300) INJECTION 61% COMPARISON:  CT scan of December 19, 2013. FINDINGS: Lower chest: Large hiatal hernia is noted. Visualized lung bases are unremarkable. Hepatobiliary: No biliary dilatation is noted. Gallbladder is dilated and thickened with surrounding inflammation and pericholecystic fluid consistent with acute cholecystitis. There appears to be a large calculus in the neck of the gallbladder. Liver is unremarkable. Pancreas: Unremarkable. No pancreatic ductal dilatation or surrounding inflammatory changes. Spleen: Normal in size without focal abnormality. Adrenals/Urinary Tract: Adrenal glands are unremarkable. Stable right renal cyst is noted. No hydronephrosis or renal obstruction is noted. Urinary bladder is unremarkable. Stomach/Bowel: The stomach appears normal. The appendix appears normal. There is no evidence of bowel obstruction. There is seen wall thickening of the hepatic flexure secondary to adjacent gallbladder inflammation. Sigmoid diverticulosis is noted without inflammation. Vascular/Lymphatic: Aortic atherosclerosis. No enlarged abdominal or pelvic lymph nodes. Reproductive: Uterus and left adnexal region are unremarkable. 5 cm right ovarian cyst is noted which is not significantly changed compared to prior exam. Other: No abdominal wall hernia or abnormality. No abdominopelvic ascites. Musculoskeletal: No acute or significant osseous findings. IMPRESSION: Dilated gallbladder is noted with large stone in neck, with significant wall thickening and  pericholecystic fluid, concerning for acute cholecystitis. These results will be called to the ordering clinician or representative by the Radiologist Assistant, and communication documented in the PACS or zVision Dashboard. Large sliding-type hiatal hernia is noted. Stable 5 cm right ovarian cyst. Follow-up pelvic ultrasound in 1 year is recommended. Aortic Atherosclerosis (ICD10-I70.0). Electronically Signed   By: Marijo Conception, M.D.   On: 07/23/2018 12:30    ____________________________________________   PROCEDURES  Procedure(s) performed:   Procedures   ____________________________________________   INITIAL IMPRESSION / ASSESSMENT AND PLAN / ED COURSE  Stable. Not peritonitic, discussed with EGS, will eval for admit.    Pertinent labs & imaging results that were available during my care of the patient were reviewed by me and considered in my medical decision making (see chart for details).  ____________________________________________  FINAL CLINICAL IMPRESSION(S) / ED DIAGNOSES  Final diagnoses:  Cholecystitis     MEDICATIONS GIVEN DURING THIS VISIT:  Medications  lactated ringers bolus 1,000 mL (has no administration in time range)  fentaNYL (SUBLIMAZE) injection 50 mcg (has no administration in time range)     NEW OUTPATIENT MEDICATIONS STARTED DURING THIS VISIT:  New Prescriptions   No medications on file    Note:  This note was prepared with assistance of Dragon voice recognition software. Occasional wrong-word or sound-a-like substitutions may have occurred due to the inherent limitations of voice recognition software.   Merrily Pew, MD 07/23/18 [phone]

## 2018-07-24 ENCOUNTER — Observation Stay (HOSPITAL_COMMUNITY): Payer: Medicare Other | Admitting: Anesthesiology

## 2018-07-24 ENCOUNTER — Encounter (HOSPITAL_COMMUNITY)
Admission: EM | Disposition: A | Discharge status: Home / Self Care | Payer: Self-pay | Source: Home / Self Care | Attending: Emergency Medicine

## 2018-07-24 DIAGNOSIS — K8000 Calculus of gallbladder with acute cholecystitis without obstruction: Secondary | ICD-10-CM | POA: Diagnosis not present

## 2018-07-24 DIAGNOSIS — I272 Pulmonary hypertension, unspecified: Secondary | ICD-10-CM | POA: Diagnosis not present

## 2018-07-24 DIAGNOSIS — I10 Essential (primary) hypertension: Secondary | ICD-10-CM | POA: Diagnosis not present

## 2018-07-24 DIAGNOSIS — K82A1 Gangrene of gallbladder in cholecystitis: Secondary | ICD-10-CM | POA: Diagnosis not present

## 2018-07-24 HISTORY — PX: CHOLECYSTECTOMY: SHX55

## 2018-07-24 LAB — COMPREHENSIVE METABOLIC PANEL
ALBUMIN: 2.8 g/dL — AB (ref 3.5–5.0)
ALK PHOS: 61 U/L (ref 38–126)
ALT: 18 U/L (ref 0–44)
AST: 22 U/L (ref 15–41)
Anion gap: 7 (ref 5–15)
BILIRUBIN TOTAL: 1.2 mg/dL (ref 0.3–1.2)
BUN: 17 mg/dL (ref 8–23)
CO2: 24 mmol/L (ref 22–32)
CREATININE: 1.3 mg/dL — AB (ref 0.44–1.00)
Calcium: 8.2 mg/dL — ABNORMAL LOW (ref 8.9–10.3)
Chloride: 102 mmol/L (ref 98–111)
GFR calc Af Amer: 44 mL/min — ABNORMAL LOW (ref >60)
GFR, EST NON AFRICAN AMERICAN: 38 mL/min — AB (ref >60)
GLUCOSE: 129 mg/dL — AB (ref 70–99)
POTASSIUM: 3.8 mmol/L (ref 3.5–5.1)
Sodium: 133 mmol/L — ABNORMAL LOW (ref 135–145)
TOTAL PROTEIN: 5.5 g/dL — AB (ref 6.5–8.1)

## 2018-07-24 LAB — CBC
HCT: 35.4 % — ABNORMAL LOW (ref 36.0–46.0)
Hemoglobin: 11.2 g/dL — ABNORMAL LOW (ref 12.0–15.0)
MCH: 29.7 pg (ref 26.0–34.0)
MCHC: 31.6 g/dL (ref 30.0–36.0)
MCV: 93.9 fL (ref 80.0–100.0)
Platelets: 165 10*3/uL (ref 150–400)
RBC: 3.77 MIL/uL — ABNORMAL LOW (ref 3.87–5.11)
RDW: 12.1 % (ref 11.5–15.5)
WBC: 11.2 10*3/uL — ABNORMAL HIGH (ref 4.0–10.5)
nRBC: 0 % (ref 0.0–0.2)

## 2018-07-24 LAB — SURGICAL PCR SCREEN
MRSA, PCR: NEGATIVE
Staphylococcus aureus: NEGATIVE

## 2018-07-24 SURGERY — LAPAROSCOPIC CHOLECYSTECTOMY WITH INTRAOPERATIVE CHOLANGIOGRAM
Anesthesia: General

## 2018-07-24 MED ORDER — BUPIVACAINE-EPINEPHRINE 0.25% -1:200000 IJ SOLN
INTRAMUSCULAR | Status: DC | PRN
  Administered 2018-07-24: 29 mL

## 2018-07-24 MED ORDER — ROCURONIUM BROMIDE 50 MG/5ML IV SOSY
PREFILLED_SYRINGE | INTRAVENOUS | Status: DC | PRN
  Administered 2018-07-24: 10 mg via INTRAVENOUS
  Administered 2018-07-24: 50 mg via INTRAVENOUS

## 2018-07-24 MED ORDER — ACETAMINOPHEN 10 MG/ML IV SOLN
INTRAVENOUS | Status: AC
  Administered 2018-07-24: 1000 mg via INTRAVENOUS
  Filled 2018-07-24: qty 100

## 2018-07-24 MED ORDER — HYDROMORPHONE HCL 1 MG/ML IJ SOLN
INTRAMUSCULAR | Status: AC
  Administered 2018-07-24: 0.25 mg via INTRAVENOUS
  Filled 2018-07-24: qty 1

## 2018-07-24 MED ORDER — ONDANSETRON HCL 4 MG/2ML IJ SOLN
INTRAMUSCULAR | Status: AC
  Filled 2018-07-24: qty 2

## 2018-07-24 MED ORDER — FENTANYL CITRATE (PF) 100 MCG/2ML IJ SOLN
INTRAMUSCULAR | Status: AC
  Administered 2018-07-24: 50 ug via INTRAVENOUS
  Filled 2018-07-24: qty 2

## 2018-07-24 MED ORDER — LIDOCAINE 2% (20 MG/ML) 5 ML SYRINGE
INTRAMUSCULAR | Status: DC | PRN
  Administered 2018-07-24: 60 mg via INTRAVENOUS

## 2018-07-24 MED ORDER — ONDANSETRON HCL 4 MG/2ML IJ SOLN
INTRAMUSCULAR | Status: AC
  Administered 2018-07-24: 4 mg via INTRAVENOUS
  Filled 2018-07-24: qty 2

## 2018-07-24 MED ORDER — PHENYLEPHRINE 40 MCG/ML (10ML) SYRINGE FOR IV PUSH (FOR BLOOD PRESSURE SUPPORT)
PREFILLED_SYRINGE | INTRAVENOUS | Status: DC | PRN
  Administered 2018-07-24 (×3): 120 ug via INTRAVENOUS

## 2018-07-24 MED ORDER — ONDANSETRON HCL 4 MG/2ML IJ SOLN
4.0000 mg | Freq: Once | INTRAMUSCULAR | Status: AC | PRN
  Administered 2018-07-24: 4 mg via INTRAVENOUS

## 2018-07-24 MED ORDER — SODIUM CHLORIDE 0.9 % IV SOLN
INTRAVENOUS | Status: DC | PRN
  Administered 2018-07-24: 40 ug/min via INTRAVENOUS

## 2018-07-24 MED ORDER — FENTANYL CITRATE (PF) 100 MCG/2ML IJ SOLN
25.0000 ug | INTRAMUSCULAR | Status: DC | PRN
  Administered 2018-07-24 (×2): 50 ug via INTRAVENOUS

## 2018-07-24 MED ORDER — SODIUM CHLORIDE 0.9 % IR SOLN
Status: DC | PRN
  Administered 2018-07-24: 3000 mL

## 2018-07-24 MED ORDER — PHENYLEPHRINE 40 MCG/ML (10ML) SYRINGE FOR IV PUSH (FOR BLOOD PRESSURE SUPPORT)
PREFILLED_SYRINGE | INTRAVENOUS | Status: AC
  Filled 2018-07-24: qty 10

## 2018-07-24 MED ORDER — EPHEDRINE 5 MG/ML INJ
INTRAVENOUS | Status: AC
  Filled 2018-07-24: qty 10

## 2018-07-24 MED ORDER — PROPOFOL 10 MG/ML IV BOLUS
INTRAVENOUS | Status: DC | PRN
  Administered 2018-07-24: 150 mg via INTRAVENOUS

## 2018-07-24 MED ORDER — FENTANYL CITRATE (PF) 100 MCG/2ML IJ SOLN
INTRAMUSCULAR | Status: DC | PRN
  Administered 2018-07-24: 25 ug via INTRAVENOUS
  Administered 2018-07-24 (×3): 50 ug via INTRAVENOUS

## 2018-07-24 MED ORDER — IOPAMIDOL (ISOVUE-300) INJECTION 61%
INTRAVENOUS | Status: AC
  Filled 2018-07-24: qty 50

## 2018-07-24 MED ORDER — BUPIVACAINE-EPINEPHRINE 0.25% -1:200000 IJ SOLN
INTRAMUSCULAR | Status: AC
  Filled 2018-07-24: qty 1

## 2018-07-24 MED ORDER — 0.9 % SODIUM CHLORIDE (POUR BTL) OPTIME
TOPICAL | Status: DC | PRN
  Administered 2018-07-24: 1000 mL

## 2018-07-24 MED ORDER — TRAMADOL HCL 50 MG PO TABS
50.0000 mg | ORAL_TABLET | Freq: Four times a day (QID) | ORAL | Status: DC | PRN
  Administered 2018-07-25: 50 mg via ORAL

## 2018-07-24 MED ORDER — DEXAMETHASONE SODIUM PHOSPHATE 10 MG/ML IJ SOLN
INTRAMUSCULAR | Status: DC | PRN
  Administered 2018-07-24: 10 mg via INTRAVENOUS

## 2018-07-24 MED ORDER — LACTATED RINGERS IV SOLN
INTRAVENOUS | Status: DC | PRN
  Administered 2018-07-24 (×2): via INTRAVENOUS

## 2018-07-24 MED ORDER — FENTANYL CITRATE (PF) 250 MCG/5ML IJ SOLN
INTRAMUSCULAR | Status: AC
  Filled 2018-07-24: qty 5

## 2018-07-24 MED ORDER — ACETAMINOPHEN 10 MG/ML IV SOLN
1000.0000 mg | Freq: Once | INTRAVENOUS | Status: DC | PRN
  Administered 2018-07-24: 1000 mg via INTRAVENOUS

## 2018-07-24 MED ORDER — SUGAMMADEX SODIUM 200 MG/2ML IV SOLN
INTRAVENOUS | Status: DC | PRN
  Administered 2018-07-24: 200 mg via INTRAVENOUS

## 2018-07-24 MED ORDER — HYDROMORPHONE HCL 1 MG/ML IJ SOLN
0.2500 mg | INTRAMUSCULAR | Status: DC | PRN
  Administered 2018-07-24 (×2): 0.25 mg via INTRAVENOUS

## 2018-07-24 MED ORDER — ROCURONIUM BROMIDE 50 MG/5ML IV SOSY
PREFILLED_SYRINGE | INTRAVENOUS | Status: AC
  Filled 2018-07-24: qty 5

## 2018-07-24 MED ORDER — LIDOCAINE 2% (20 MG/ML) 5 ML SYRINGE
INTRAMUSCULAR | Status: AC
  Filled 2018-07-24: qty 5

## 2018-07-24 MED ORDER — DEXAMETHASONE SODIUM PHOSPHATE 10 MG/ML IJ SOLN
INTRAMUSCULAR | Status: AC
  Filled 2018-07-24: qty 1

## 2018-07-24 SURGICAL SUPPLY — 46 items
ADH SKN CLS APL DERMABOND .7 (GAUZE/BANDAGES/DRESSINGS) ×1
ADH SKN CLS LQ APL DERMABOND (GAUZE/BANDAGES/DRESSINGS) ×1
APPLIER CLIP 5 13 M/L LIGAMAX5 (MISCELLANEOUS) ×2
APR CLP MED LRG 5 ANG JAW (MISCELLANEOUS) ×1
BAG SPEC RTRVL 10 TROC 200 (ENDOMECHANICALS) ×1
BLADE CLIPPER SURG (BLADE) IMPLANT
CANISTER SUCT 3000ML PPV (MISCELLANEOUS) ×2 IMPLANT
CHLORAPREP W/TINT 26ML (MISCELLANEOUS) ×2 IMPLANT
CLIP APPLIE 5 13 M/L LIGAMAX5 (MISCELLANEOUS) ×1 IMPLANT
COVER MAYO STAND STRL (DRAPES) IMPLANT
COVER SURGICAL LIGHT HANDLE (MISCELLANEOUS) ×2 IMPLANT
COVER WAND RF STERILE (DRAPES) ×2 IMPLANT
DERMABOND ADHESIVE PROPEN (GAUZE/BANDAGES/DRESSINGS) ×1
DERMABOND ADVANCED (GAUZE/BANDAGES/DRESSINGS) ×1
DERMABOND ADVANCED .7 DNX12 (GAUZE/BANDAGES/DRESSINGS) ×1 IMPLANT
DERMABOND ADVANCED .7 DNX6 (GAUZE/BANDAGES/DRESSINGS) IMPLANT
DRAPE C-ARM 42X72 X-RAY (DRAPES) IMPLANT
DRSG TEGADERM 2-3/8X2-3/4 SM (GAUZE/BANDAGES/DRESSINGS) ×8 IMPLANT
ELECT REM PT RETURN 9FT ADLT (ELECTROSURGICAL) ×2
ELECTRODE REM PT RTRN 9FT ADLT (ELECTROSURGICAL) ×1 IMPLANT
GLOVE BIOGEL PI IND STRL 8 (GLOVE) ×1 IMPLANT
GLOVE BIOGEL PI INDICATOR 8 (GLOVE) ×1
GLOVE ECLIPSE 7.5 STRL STRAW (GLOVE) ×2 IMPLANT
GOWN STRL REUS W/ TWL LRG LVL3 (GOWN DISPOSABLE) ×3 IMPLANT
GOWN STRL REUS W/TWL LRG LVL3 (GOWN DISPOSABLE) ×6
KIT BASIN OR (CUSTOM PROCEDURE TRAY) ×2 IMPLANT
KIT TURNOVER KIT B (KITS) ×2 IMPLANT
NS IRRIG 1000ML POUR BTL (IV SOLUTION) ×2 IMPLANT
PAD ARMBOARD 7.5X6 YLW CONV (MISCELLANEOUS) ×2 IMPLANT
POUCH RETRIEVAL ECOSAC 10 (ENDOMECHANICALS) ×1 IMPLANT
POUCH RETRIEVAL ECOSAC 10MM (ENDOMECHANICALS) ×1
SCISSORS LAP 5X35 DISP (ENDOMECHANICALS) ×2 IMPLANT
SET CHOLANGIOGRAPH 5 50 .035 (SET/KITS/TRAYS/PACK) IMPLANT
SET IRRIG TUBING LAPAROSCOPIC (IRRIGATION / IRRIGATOR) ×2 IMPLANT
SLEEVE ENDOPATH XCEL 5M (ENDOMECHANICALS) ×4 IMPLANT
SPECIMEN JAR SMALL (MISCELLANEOUS) ×2 IMPLANT
STRIP CLOSURE SKIN 1/2X4 (GAUZE/BANDAGES/DRESSINGS) ×2 IMPLANT
SUT MNCRL AB 4-0 PS2 18 (SUTURE) ×2 IMPLANT
SUT VICRYL 0 UR6 27IN ABS (SUTURE) ×1 IMPLANT
TOWEL OR 17X24 6PK STRL BLUE (TOWEL DISPOSABLE) ×2 IMPLANT
TOWEL OR 17X26 10 PK STRL BLUE (TOWEL DISPOSABLE) ×2 IMPLANT
TRAY LAPAROSCOPIC MC (CUSTOM PROCEDURE TRAY) ×2 IMPLANT
TROCAR XCEL BLUNT TIP 100MML (ENDOMECHANICALS) ×3 IMPLANT
TROCAR XCEL NON-BLD 5MMX100MML (ENDOMECHANICALS) ×2 IMPLANT
TUBING INSUFFLATION (TUBING) ×2 IMPLANT
WATER STERILE IRR 1000ML POUR (IV SOLUTION) ×2 IMPLANT

## 2018-07-24 NOTE — Op Note (Signed)
OPERATIVE REPORT  DATE OF OPERATION: 07/24/2018  PATIENT:  Sandy [name]  84 y.o. female  PRE-OPERATIVE DIAGNOSIS:  Acute cholecystitis and cholelithiasis  POST-OPERATIVE DIAGNOSIS: Cholelithiasis and empyema of the gallbladder  INDICATION(S) FOR OPERATION:  RUQ pain and tenderness and CT scan demonstrating acute cholecystitis  FINDINGS:  Pus in the lumen of the markedly enlarged and distended gallbladder.  Normal critical window between the gallbladder, the liver edge and the CBD.  No cholangiogram performed.  4 cm x 3 cm gallstone  PROCEDURE:  Procedure(s): LAPAROSCOPIC CHOLECYSTECTOMY  SURGEON:  Surgeon(s): Judeth Horn, MD  ASSISTANT: None  ANESTHESIA:   general  COMPLICATIONS:  None  EBL: 100 ml  BLOOD ADMINISTERED: none  DRAINS: none   SPECIMEN:  Source of Specimen:  Gallbladder and contents  COUNTS CORRECT:  YES  PROCEDURE DETAILS: The patient was taken to the operating room and placed on the table in the supine position.  After an adequate endotracheal anesthetic was administered, the patient was prepped with ChloroPrep, and then draped in the usual manner exposing the entire abdomen laterally, inferiorly and up  to the costal margins.  After a proper timeout was performed including identifying the patient and the procedure to be performed, a infraumbilical 4.0JW midline incision was made using a #15 blade.  This was taken down to the fascia which was then incised with a #15 blade.  The edges of the fascia were tented up with Kocher clamps as the preperitoneal space was penetrated with a Kelly clamp into the peritoneum.  Once this was done, a pursestring suture of 0 Vicryl was passed around the fascial opening.  This was subsequently used to secure the [facility] cannula which was passed into the peritoneal cavity.  Once the [facility] cannula was in place, carbon dioxide gas was insufflated into the peritoneal cavity up to a maximal intra-abdominal pressure of 98mm  Hg.The laparoscope, with attached camera and light source, was passed into the peritoneal cavity to visualize the direct insertion of two right upper quadrant 34mm cannulas, and a sup-xiphoid 62mm cannula.  Once all cannulas were in place, the dissection was begun.  The gallbladder dome was covered with omentum which had to be bluntly dissected away inorder to try to grasp the structure.  It required decompression using a suction aspirator with a needle, and thick, purulent and hemorrhagic fluid was obtained.  The gallbladder was friable and very thick walled.  Once the gallbladder was decompressed, two ratcheted graspers were attached to the dome and infundibulum of the gallbladder and retracted towards the anterior abdominal wall and the right upper quadrant.  Using cautery attached to a dissecting forceps, the peritoneum overlaying the triangle of Chalot and the hepatoduodenal triangle was dissected away exposing the cystic duct and the cystic artery.  A critical window was developed between the CBD and the cystic duct The cystic artery was clipped proximally and distally then transected.  A clip was placed on the gallbladder [name] of the cystic duct, then the distal cystic duct was clipped three times then transected between the clips.  The gallbladder was then dissected out of the hepatic bed requiring extreme patience and care because of the friability and inflammation.  There was bleeding from the bed that required electrocautery control.  It was retrieved from the abdomen using an EcoPouch bag without event.  Once the gallbladder was removed, the bed was inspected for hemostasis.  Once excellent hemostasis was obtained all gas and fluids were aspirated from above the liver, then  the cannulas were removed.  The infraumbilical incision was closed using the pursestring suture which was in place.  0.25% bupivicaine with epinephrine was injected at all sites.  All 89mm or greater cannula sites were close  using a running subcuticular stitch of 4-0 Monocryl.  5.60mm cannula sites were closed with Dermabond only.Steri-Strips and Tagaderm were used to complete the dressings at all sites.  At this point all needle, sponge, and instrument counts were correct.The patient was awakened from anesthesia and taken to the PACU in stable condition.  Sandy [name]. Sandy Bailiff, MD, Sandy [name] [phone] Sandy [name]  PATIENT DISPOSITION:  PACU - hemodynamically stable.   Judeth Horn 11/28/20192:13 PM

## 2018-07-24 NOTE — Progress Notes (Signed)
Patient still has tenderness in the RUQ, palpable GB, but was able to sleep with pain medications being given.  Will go ahead and take to surgery today for laparoscopic cholecystectomy with possible IOC.  Risks and benefits have been explained to the patient and she wishes to proceed.  Wants to make sure family is here prior to surgery.  Kathryne Eriksson. Dahlia Bailiff, MD, [place] [phone] [facility]

## 2018-07-24 NOTE — Transfer of Care (Signed)
Immediate Anesthesia Transfer of Care Note  Patient: Sandy [name]  Procedure(s) Performed: LAPAROSCOPIC CHOLECYSTECTOMY (N/A )  Patient Location: PACU  Anesthesia Type:General  Level of Consciousness: awake, alert  and oriented  Airway & Oxygen Therapy: Patient Spontanous Breathing  Post-op Assessment: Report given to RN and Post -op Vital signs reviewed and stable  Post vital signs: Reviewed and stable  Last Vitals:  Vitals Value Taken Time  BP 142/70 07/24/2018  2:08 PM  Temp    Pulse 85 07/24/2018  2:09 PM  Resp 13 07/24/2018  2:09 PM  SpO2 92 % 07/24/2018  2:09 PM  Vitals shown include unvalidated device data.  Last Pain:  Vitals:   07/24/18 0528  TempSrc:   PainSc: 4       Patients Stated Pain Goal: 0 (58/83/25 4982)  Complications: No apparent anesthesia complications

## 2018-07-24 NOTE — Anesthesia Procedure Notes (Signed)
Procedure Name: Intubation Date/Time: 07/24/2018 12:18 PM Performed by: Marsa Aris, CRNA Pre-anesthesia Checklist: Patient identified, Emergency Drugs available, Suction available and Patient being monitored Patient Re-evaluated:Patient Re-evaluated prior to induction Oxygen Delivery Method: Circle System Utilized Preoxygenation: Pre-oxygenation with 100% oxygen Induction Type: IV induction Ventilation: Mask ventilation without difficulty Laryngoscope Size: Miller and 2 Grade View: Grade I Tube type: Oral Tube size: 7.0 mm Number of attempts: 1 Airway Equipment and Method: Stylet and Oral airway Placement Confirmation: ETT inserted through vocal cords under direct vision,  positive ETCO2 and breath sounds checked- equal and bilateral Secured at: 21 cm Tube secured with: Tape Dental Injury: Teeth and Oropharynx as per pre-operative assessment  Comments: No change in dentition from pre-procedure.

## 2018-07-24 NOTE — Anesthesia Preprocedure Evaluation (Addendum)
Anesthesia Evaluation  Patient identified by MRN, date of birth, ID band Patient awake    Reviewed: Allergy & Precautions, NPO status , Patient's Chart, lab work & pertinent test results  Airway Mallampati: IV  TM Distance: >3 FB Neck ROM: Full    Dental no notable dental hx.    Pulmonary neg pulmonary ROS,    Pulmonary exam normal breath sounds clear to auscultation       Cardiovascular hypertension, Pt. on medications Normal cardiovascular exam Rhythm:Regular Rate:Normal  ECG-SR, rate 94  Pulmonary hypertension    Neuro/Psych PSYCHIATRIC DISORDERS Depression negative neurological ROS     GI/Hepatic Neg liver ROS, hiatal hernia, GERD  Medicated and Controlled,  Endo/Other  negative endocrine ROS  Renal/GU negative Renal ROS     Musculoskeletal negative musculoskeletal ROS (+)   Abdominal   Peds  Hematology  (+) anemia , HLD   Anesthesia Other Findings Acute cholecystitis  Cholelithiasis  Reproductive/Obstetrics                            Anesthesia Physical Anesthesia Plan  ASA: III  Anesthesia Plan: General   Post-op Pain Management:    Induction: Intravenous  PONV Risk Score and Plan: 4 or greater and Dexamethasone, Ondansetron and Treatment may vary due to age or medical condition  Airway Management Planned: Oral ETT  Additional Equipment:   Intra-op Plan:   Post-operative Plan: Extubation in OR  Informed Consent: I have reviewed the patients History and Physical, chart, labs and discussed the procedure including the risks, benefits and alternatives for the proposed anesthesia with the patient or authorized representative who has indicated his/her understanding and acceptance.   Dental advisory given  Plan Discussed with: CRNA  Anesthesia Plan Comments:         Anesthesia Quick Evaluation

## 2018-07-24 NOTE — Anesthesia Postprocedure Evaluation (Signed)
Anesthesia Post Note  Patient: Sandy [name]  Procedure(s) Performed: LAPAROSCOPIC CHOLECYSTECTOMY (N/A )     Patient location during evaluation: PACU Anesthesia Type: General Level of consciousness: awake and alert Pain management: pain level controlled Vital Signs Assessment: post-procedure vital signs reviewed and stable Respiratory status: spontaneous breathing, nonlabored ventilation, respiratory function stable and patient connected to nasal cannula oxygen Cardiovascular status: blood pressure returned to baseline and stable Postop Assessment: no apparent nausea or vomiting Anesthetic complications: no    Last Vitals:  Vitals:   07/24/18 1731 07/24/18 1742  BP: (!) 87/58 110/76  Pulse: 79   Resp:    Temp: 36.9 C   SpO2: 97%     Last Pain:  Vitals:   07/24/18 1824  TempSrc:   PainSc: 4                  Ryan P Ellender

## 2018-07-24 NOTE — Progress Notes (Signed)
Patient arrived back to 6n17 A&Ox4, very emotional, LW IV intact and infusing.  VSS.  Noted to have 4 port sites with steristrip closure and tegaderm dressing.  C/O of soreness.  Family at bedside.  Will continue to monitor.

## 2018-07-25 ENCOUNTER — Encounter (HOSPITAL_COMMUNITY): Payer: Self-pay | Admitting: General Surgery

## 2018-07-25 MED ORDER — ALUM & MAG HYDROXIDE-SIMETH 200-200-20 MG/5ML PO SUSP
15.0000 mL | Freq: Four times a day (QID) | ORAL | Status: DC | PRN
  Administered 2018-07-25: 15 mL via ORAL
  Filled 2018-07-25: qty 30

## 2018-07-25 NOTE — Progress Notes (Signed)
Patient ID: Sandy [name], female   DOB: 03/20/1933, 84 y.o.   MRN: [id number] [facility] Progress Note:   1 Day Post-Op  Subjective: Mental status is very clear;   Objective: Vital signs in last 24 hours: Temp:  [97.5 F (36.4 C)-98.4 F (36.9 C)] 98.2 F (36.8 C) (11/29 0538) Pulse Rate:  [75-93] 85 (11/29 0538) Resp:  [13-17] 16 (11/29 0538) BP: (87-158)/(47-107) 158/80 (11/29 0538) SpO2:  [94 %-100 %] 97 % (11/29 0538)  Intake/Output from previous day: 11/28 0701 - 11/29 0700 In: 3575.3 [P.O.:420; I.V.:2655.4; IV Piggyback:299.9] Out: 1480 [Urine:1450; Blood:30] Intake/Output this shift: Total I/O In: -  Out: 500 [Urine:500]  Physical Exam: Work of breathing is normal;  Incisions OK  Lab Results:  Results for orders placed or performed during the hospital encounter of 07/23/18 (from the past 48 hour(s))  Surgical pcr screen     Status: None   Collection Time: 07/23/18  8:38 PM  Result Value Ref Range   MRSA, PCR NEGATIVE NEGATIVE   Staphylococcus aureus NEGATIVE NEGATIVE    Comment: (NOTE) The Xpert SA Assay (FDA approved for NASAL specimens in patients 43 years of age and older), is one component of a comprehensive surveillance program. It is not intended to diagnose infection nor to guide or monitor treatment. Performed at [facility], [place] [address], [place], [place] [address]   Comprehensive metabolic panel     Status: Abnormal   Collection Time: 07/24/18  3:16 AM  Result Value Ref Range   Sodium 133 (L) 135 - 145 mmol/L   Potassium 3.8 3.5 - 5.1 mmol/L   Chloride 102 98 - 111 mmol/L   CO2 24 22 - 32 mmol/L   Glucose, Bld 129 (H) 70 - 99 mg/dL   BUN 17 8 - 23 mg/dL   Creatinine, Ser 1.30 (H) 0.44 - 1.00 mg/dL   Calcium 8.2 (L) 8.9 - 10.3 mg/dL   Total Protein 5.5 (L) 6.5 - 8.1 g/dL   Albumin 2.8 (L) 3.5 - 5.0 g/dL   AST 22 15 - 41 U/L   ALT 18 0 - 44 U/L   Alkaline Phosphatase 61 38 - 126 U/L   Total Bilirubin 1.2 0.3 - 1.2  mg/dL   GFR calc non Af Amer 38 (L) >60 mL/min   GFR calc Af Amer 44 (L) >60 mL/min   Anion gap 7 5 - 15    Comment: Performed at [facility], [place] [address], [place], [place] [address]  CBC     Status: Abnormal   Collection Time: 07/24/18  3:16 AM  Result Value Ref Range   WBC 11.2 (H) 4.0 - 10.5 K/uL   RBC 3.77 (L) 3.87 - 5.11 MIL/uL   Hemoglobin 11.2 (L) 12.0 - 15.0 g/dL   HCT 35.4 (L) 36.0 - 46.0 %   MCV 93.9 80.0 - 100.0 fL   MCH 29.7 26.0 - 34.0 pg   MCHC 31.6 30.0 - 36.0 g/dL   RDW 12.1 11.5 - 15.5 %   Platelets 165 150 - 400 K/uL   nRBC 0.0 0.0 - 0.2 %    Comment: Performed at [facility], [place] [address], [place], [place] [address]    Radiology/Results: Ct Abdomen Pelvis W Contrast  Result Date: 07/23/2018 CLINICAL DATA:  Left lower quadrant abdominal pain. EXAM: CT ABDOMEN AND PELVIS WITH CONTRAST TECHNIQUE: Multidetector CT imaging of the abdomen and pelvis was performed using the standard protocol following bolus administration  of intravenous contrast. CONTRAST:  156mL ISOVUE-300 IOPAMIDOL (ISOVUE-300) INJECTION 61% COMPARISON:  CT scan of December 19, 2013. FINDINGS: Lower chest: Large hiatal hernia is noted. Visualized lung bases are unremarkable. Hepatobiliary: No biliary dilatation is noted. Gallbladder is dilated and thickened with surrounding inflammation and pericholecystic fluid consistent with acute cholecystitis. There appears to be a large calculus in the neck of the gallbladder. Liver is unremarkable. Pancreas: Unremarkable. No pancreatic ductal dilatation or surrounding inflammatory changes. Spleen: Normal in size without focal abnormality. Adrenals/Urinary Tract: Adrenal glands are unremarkable. Stable right renal cyst is noted. No hydronephrosis or renal obstruction is noted. Urinary bladder is unremarkable. Stomach/Bowel: The stomach appears normal. The appendix appears normal. There is no evidence of bowel obstruction. There is seen wall thickening of  the hepatic flexure secondary to adjacent gallbladder inflammation. Sigmoid diverticulosis is noted without inflammation. Vascular/Lymphatic: Aortic atherosclerosis. No enlarged abdominal or pelvic lymph nodes. Reproductive: Uterus and left adnexal region are unremarkable. 5 cm right ovarian cyst is noted which is not significantly changed compared to prior exam. Other: No abdominal wall hernia or abnormality. No abdominopelvic ascites. Musculoskeletal: No acute or significant osseous findings. IMPRESSION: Dilated gallbladder is noted with large stone in neck, with significant wall thickening and pericholecystic fluid, concerning for acute cholecystitis. These results will be called to the ordering clinician or representative by the Radiologist Assistant, and communication documented in the PACS or zVision Dashboard. Large sliding-type hiatal hernia is noted. Stable 5 cm right ovarian cyst. Follow-up pelvic ultrasound in 1 year is recommended. Aortic Atherosclerosis (ICD10-I70.0). Electronically Signed   By: Marijo Conception, M.D.   On: 07/23/2018 12:30    Anti-infectives: Anti-infectives (From admission, onward)   Start     Dose/Rate Route Frequency Ordered Stop   07/23/18 1630  cefTRIAXone (ROCEPHIN) 2 g in sodium chloride 0.9 % 100 mL IVPB     2 g 200 mL/hr over 30 Minutes Intravenous Every 24 hours 07/23/18 1606        Assessment/Plan: Problem List: Patient Active Problem List   Diagnosis Date Noted  . Acute calculous cholecystitis 07/23/2018  . Heme positive stool 01/04/2014  . Special screening for malignant neoplasms, colon 01/04/2014  . GERD (gastroesophageal reflux disease) 01/04/2014  . Diverticulitis 01/04/2014  . HYPERTENSION, PULMONARY 07/31/2010  . HYPERLIPIDEMIA 07/28/2010  . DEPRESSION 07/28/2010  . HYPERTENSION 07/28/2010  . VENTRICULAR HYPERTROPHY, LEFT 07/28/2010  . G E R D 07/28/2010    Younger than 84 year old female with advanced cholecystitis yesterday;  Will  continue antibiotics and continue to observe in house.   1 Day Post-Op    LOS: 0 days   Matt B. Hassell Done, MD, [facility] [phone] beeper [phone]  07/25/2018 8:30 AM

## 2018-07-26 NOTE — Progress Notes (Signed)
Patient ID: Sandy [name], female   DOB: 04/23/1933, 84 y.o.   MRN: [id number] [facility] Progress Note:   2 Days Post-Op  Subjective: Mental status is clear.  Feeling better taking PO Objective: Vital signs in last 24 hours: Temp:  [97.7 F (36.5 C)-97.9 F (36.6 C)] 97.9 F (36.6 C) (11/30 0610) Pulse Rate:  [71-90] 71 (11/30 0610) Resp:  [16-18] 16 (11/30 0610) BP: (114-158)/(62-83) 125/62 (11/30 0610) SpO2:  [97 %-98 %] 98 % (11/30 0610)  Intake/Output from previous day: 11/29 0701 - 11/30 0700 In: 3649.3 [P.O.:1740; I.V.:1809.3; IV Piggyback:100] Out: 6967 [Urine:1251] Intake/Output this shift: Total I/O In: 240 [P.O.:240] Out: 400 [Urine:400]  Physical Exam: Work of breathing is normal.  Incisions OK  Lab Results:  No results found for this or any previous visit (from the past 48 hour(s)).  Radiology/Results: No results found.  Anti-infectives: Anti-infectives (From admission, onward)   Start     Dose/Rate Route Frequency Ordered Stop   07/23/18 1630  cefTRIAXone (ROCEPHIN) 2 g in sodium chloride 0.9 % 100 mL IVPB     2 g 200 mL/hr over 30 Minutes Intravenous Every 24 hours 07/23/18 1606        Assessment/Plan: Problem List: Patient Active Problem List   Diagnosis Date Noted  . Acute calculous cholecystitis 07/23/2018  . Heme positive stool 01/04/2014  . Special screening for malignant neoplasms, colon 01/04/2014  . GERD (gastroesophageal reflux disease) 01/04/2014  . Diverticulitis 01/04/2014  . HYPERTENSION, PULMONARY 07/31/2010  . HYPERLIPIDEMIA 07/28/2010  . DEPRESSION 07/28/2010  . HYPERTENSION 07/28/2010  . VENTRICULAR HYPERTROPHY, LEFT 07/28/2010  . G E R D 07/28/2010    On second day of postop antibiotics and observation.  Will plan discharge tomorrow.   2 Days Post-Op    LOS: 0 days   Matt B. Hassell Done, MD, [facility] [phone] beeper [phone]  07/26/2018 10:44 AM

## 2018-07-27 LAB — CBC WITH DIFFERENTIAL/PLATELET
Abs Immature Granulocytes: 0.04 10*3/uL (ref 0.00–0.07)
Basophils Absolute: 0 10*3/uL (ref 0.0–0.1)
Basophils Relative: 0 %
Eosinophils Absolute: 0.2 10*3/uL (ref 0.0–0.5)
Eosinophils Relative: 3 %
HCT: 37.2 % (ref 36.0–46.0)
Hemoglobin: 12.1 g/dL (ref 12.0–15.0)
Immature Granulocytes: 1 %
LYMPHS ABS: 1.8 10*3/uL (ref 0.7–4.0)
Lymphocytes Relative: 27 %
MCH: 30.9 pg (ref 26.0–34.0)
MCHC: 32.5 g/dL (ref 30.0–36.0)
MCV: 94.9 fL (ref 80.0–100.0)
MONOS PCT: 12 %
Monocytes Absolute: 0.8 10*3/uL (ref 0.1–1.0)
Neutro Abs: 3.9 10*3/uL (ref 1.7–7.7)
Neutrophils Relative %: 57 %
Platelets: 186 10*3/uL (ref 150–400)
RBC: 3.92 MIL/uL (ref 3.87–5.11)
RDW: 12.5 % (ref 11.5–15.5)
WBC: 6.8 10*3/uL (ref 4.0–10.5)
nRBC: 0 % (ref 0.0–0.2)

## 2018-07-27 LAB — COMPREHENSIVE METABOLIC PANEL
ALT: 59 U/L — ABNORMAL HIGH (ref 0–44)
AST: 57 U/L — ABNORMAL HIGH (ref 15–41)
Albumin: 2.6 g/dL — ABNORMAL LOW (ref 3.5–5.0)
Alkaline Phosphatase: 110 U/L (ref 38–126)
Anion gap: 10 (ref 5–15)
BILIRUBIN TOTAL: 0.4 mg/dL (ref 0.3–1.2)
BUN: 12 mg/dL (ref 8–23)
CO2: 27 mmol/L (ref 22–32)
Calcium: 8.7 mg/dL — ABNORMAL LOW (ref 8.9–10.3)
Chloride: 97 mmol/L — ABNORMAL LOW (ref 98–111)
Creatinine, Ser: 0.84 mg/dL (ref 0.44–1.00)
GFR calc Af Amer: >60 mL/min (ref >60)
GFR calc non Af Amer: >60 mL/min (ref >60)
Glucose, Bld: 106 mg/dL — ABNORMAL HIGH (ref 70–99)
Potassium: 4 mmol/L (ref 3.5–5.1)
Sodium: 134 mmol/L — ABNORMAL LOW (ref 135–145)
Total Protein: 6.3 g/dL — ABNORMAL LOW (ref 6.5–8.1)

## 2018-07-27 MED ORDER — MAGNESIUM HYDROXIDE 400 MG/5ML PO SUSP
5.0000 mL | Freq: Every day | ORAL | Status: DC | PRN
  Administered 2018-07-27: 5 mL via ORAL
  Filled 2018-07-27: qty 30

## 2018-07-27 MED ORDER — BISACODYL 10 MG RE SUPP
10.0000 mg | Freq: Every day | RECTAL | Status: DC | PRN
  Administered 2018-07-27: 10 mg via RECTAL
  Filled 2018-07-27: qty 1

## 2018-07-27 MED ORDER — MAGNESIUM HYDROXIDE 400 MG/5ML PO SUSP: 15.0000 mL | Freq: Every day | ORAL | Status: DC | PRN

## 2018-07-27 NOTE — Progress Notes (Signed)
Patient ID: Sandy [name], female   DOB: Sep 13, 1932, 84 y.o.   MRN: [id number] [facility] Progress Note:   3 Days Post-Op  Subjective: Mental status is alert Objective: Vital signs in last 24 hours: Temp:  [97.9 F (36.6 C)-98.5 F (36.9 C)] 98.2 F (36.8 C) (12/01 0544) Pulse Rate:  [71-79] 71 (12/01 0544) Resp:  [13-16] 13 (12/01 0544) BP: (147-173)/(77-81) 147/77 (12/01 0544) SpO2:  [95 %-97 %] 95 % (12/01 0544)  Intake/Output from previous day: 11/30 0701 - 12/01 0700 In: 6930.1 [P.O.:780; I.V.:6050.1; IV Piggyback:100] Out: 5997 [Urine:1650] Intake/Output this shift: No intake/output data recorded.  Physical Exam: Work of breathing is not labored.  Incisions ok  Lab Results:  No results found for this or any previous visit (from the past 48 hour(s)).  Radiology/Results: No results found.  Anti-infectives: Anti-infectives (From admission, onward)   Start     Dose/Rate Route Frequency Ordered Stop   07/23/18 1630  cefTRIAXone (ROCEPHIN) 2 g in sodium chloride 0.9 % 100 mL IVPB     2 g 200 mL/hr over 30 Minutes Intravenous Every 24 hours 07/23/18 1606        Assessment/Plan: Problem List: Patient Active Problem List   Diagnosis Date Noted  . Acute calculous cholecystitis 07/23/2018  . Heme positive stool 01/04/2014  . Special screening for malignant neoplasms, colon 01/04/2014  . GERD (gastroesophageal reflux disease) 01/04/2014  . Diverticulitis 01/04/2014  . HYPERTENSION, PULMONARY 07/31/2010  . HYPERLIPIDEMIA 07/28/2010  . DEPRESSION 07/28/2010  . HYPERTENSION 07/28/2010  . VENTRICULAR HYPERTROPHY, LEFT 07/28/2010  . G E R D 07/28/2010    Will check labs this am.  Anticipate discharge either later today or tomorrow.   3 Days Post-Op    LOS: 0 days   Matt B. Hassell Done, MD, [facility] [phone] beeper [phone]  07/27/2018 8:59 AM

## 2018-07-27 NOTE — Plan of Care (Signed)

## 2018-07-27 NOTE — Plan of Care (Signed)

## 2018-07-28 MED ORDER — OXYCODONE HCL 5 MG PO TABS: 5.0000 mg | ORAL_TABLET | ORAL | 0 refills | Status: DC | PRN

## 2018-07-28 MED ORDER — ACETAMINOPHEN 325 MG PO TABS: 650.0000 mg | ORAL_TABLET | Freq: Four times a day (QID) | ORAL | Status: AC | PRN

## 2018-07-28 NOTE — Discharge Summary (Signed)
[facility] Discharge Summary   Patient ID: Sandy [name] MRN: [id number] DOB/AGE: 1933/07/08 84 y.o.  Admit date: 07/23/2018 Discharge date: 07/28/2018  Admitting Diagnosis: Acute calculous cholecystitis  Discharge Diagnosis Acute calculous cholecystitis  Consultants None   Imaging: No results found.  Procedures Dr. Hulen Skains (07/24/18) - Laparoscopic Cholecystectomy    Hospital Course:  Patient is a 84 year old female who presented to [facility] with RUQ pain.  Workup showed acute calculous cholecystitis.  Patient was admitted and underwent procedure listed above.  Tolerated procedure well and was transferred to the floor.  Diet was advanced as tolerated.  On POD4, the patient was voiding well, tolerating diet, ambulating well, pain well controlled, vital signs stable, incisions c/d/i and felt stable for discharge home.  Patient will follow up in our office in 2 weeks and knows to call with questions or concerns. She will call to confirm appointment date/time.    Physical Exam: General:  Alert, NAD, pleasant, comfortable Abd:  Soft, ND, mild tenderness, incisions C/D/I  Allergies as of 07/28/2018   No Known Allergies     Medication List    TAKE these medications   acetaminophen 325 MG tablet Commonly known as:  TYLENOL Take 2 tablets (650 mg total) by mouth every 6 (six) hours as needed.   clonazePAM 0.5 MG tablet Commonly known as:  KLONOPIN Take 0.5 mg by mouth 2 (two) times daily as needed for anxiety.   ergocalciferol 1.25 MG (50000 UT) capsule Commonly known as:  VITAMIN D2 Take 50,000 Units by mouth every Friday.   ibuprofen 200 MG tablet Commonly known as:  ADVIL,MOTRIN Take 400 mg by mouth every 6 (six) hours as needed (pain).   lansoprazole 30 MG capsule Commonly known as:  PREVACID Take 30 mg by mouth every morning.   losartan 50 MG tablet Commonly known as:  COZAAR Take 50 mg by mouth daily.   oxyCODONE 5 MG immediate release  tablet Commonly known as:  Oxy IR/ROXICODONE Take 1 tablet (5 mg total) by mouth every 4 (four) hours as needed for moderate pain.   rosuvastatin 10 MG tablet Commonly known as:  CRESTOR Take 10 mg by mouth 2 (two) times a week. Tuesday and Friday        Follow-up Information    [facility]. Go on 08/12/2018.   Specialty:  General Surgery Why:  Follow up appointment scheduled for 9:00 AM. Please arrive 30 min prior to appointment time. Bring photo ID and insurance information.  Contact information: [address] [place] [address] [phone]           Signed: Brigid Re, [facility] 07/28/2018, 8:31 AM Pager: [phone] Consults: [phone] Mon-Fri 7:00 am-4:30 pm Sat-Sun 7:00 am-11:30 am

## 2018-07-28 NOTE — Discharge Instructions (Signed)
[facility] LAPAROSCOPIC SURGERY: POST OP INSTRUCTIONS Always review your discharge instruction sheet given to you by the facility where your surgery was performed. IF YOU HAVE DISABILITY OR FAMILY LEAVE FORMS, YOU MUST BRING THEM TO THE OFFICE FOR PROCESSING.   DO NOT GIVE THEM TO YOUR DOCTOR.  PAIN CONTROL  1. First take acetaminophen (Tylenol) AND/or ibuprofen (Advil) to control your pain after surgery.  Follow directions on package.  Taking acetaminophen (Tylenol) and/or ibuprofen (Advil) regularly after surgery will help to control your pain and lower the amount of prescription pain medication you may need.  You should not take more than 3,000 mg (3 grams) of acetaminophen (Tylenol) in 24 hours.  You should not take ibuprofen (Advil), aleve, motrin, naprosyn or other NSAIDS if you have a history of stomach ulcers or chronic kidney disease.  2. A prescription for pain medication may be given to you upon discharge.  Take your pain medication as prescribed, if you still have uncontrolled pain after taking acetaminophen (Tylenol) or ibuprofen (Advil). 3. Use ice packs to help control pain. 4. If you need a refill on your pain medication, please contact your pharmacy.  They will contact our office to request authorization. Prescriptions will not be filled after 5pm or on week-ends.  HOME MEDICATIONS 5. Take your usually prescribed medications unless otherwise directed.  DIET 6. You should follow a light diet the first few days after arrival home.  Be sure to include lots of fluids daily. Avoid fatty, fried foods.   CONSTIPATION 7. It is common to experience some constipation after surgery and if you are taking pain medication.  Increasing fluid intake and taking a stool softener (such as Colace) will usually help or prevent this problem from occurring.  A mild laxative (Milk of Magnesia or Miralax) should be taken according to package instructions if there are no bowel  movements after 48 hours.  WOUND/INCISION CARE 8. Most patients will experience some swelling and bruising in the area of the incisions.  Ice packs will help.  Swelling and bruising can take several days to resolve.  9. Unless discharge instructions indicate otherwise, follow guidelines below  a. STERI-STRIPS - you may remove your outer bandages 48 hours after surgery, and you may shower at that time.  You have steri-strips (small skin tapes) in place directly over the incision.  These strips should be left on the skin for 7-10 days.   b. DERMABOND/SKIN GLUE - you may shower in 24 hours.  The glue will flake off over the next 2-3 weeks. 10. Any sutures or staples will be removed at the office during your follow-up visit.  ACTIVITIES 11. You may resume regular (light) daily activities beginning the next day--such as daily self-care, walking, climbing stairs--gradually increasing activities as tolerated.  You may have sexual intercourse when it is comfortable.  Refrain from any heavy lifting or straining until approved by your doctor. a. You may drive when you are no longer taking prescription pain medication, you can comfortably wear a seatbelt, and you can safely maneuver your car and apply brakes.  FOLLOW-UP 12. You should see your doctor in the office for a follow-up appointment approximately 2-3 weeks after your surgery.  You should have been given your post-op/follow-up appointment when your surgery was scheduled.  If you did not receive a post-op/follow-up appointment, make sure that you call for this appointment within a day or two after you arrive home to insure a convenient appointment time.  WHEN   TO CALL YOUR DOCTOR: 1. Fever over 101.0 2. Inability to urinate 3. Continued bleeding from incision. 4. Increased pain, redness, or drainage from the incision. 5. Increasing abdominal pain  The clinic staff is available to answer your questions during regular business hours.  Please don't  hesitate to call and ask to speak to one of the nurses for clinical concerns.  If you have a medical emergency, go to the nearest emergency room or call 911.  A surgeon from [facility] is always on call at the hospital. [address], [address], [place], [place]  [address] ? [address] [address], [place], [place]   [address] [phone] ? [phone] ? FAX [phone] Web site: [email or web address]  

## 2018-10-28 DIAGNOSIS — K5792 Diverticulitis of intestine, part unspecified, without perforation or abscess without bleeding: Secondary | ICD-10-CM | POA: Diagnosis not present

## 2018-10-28 DIAGNOSIS — F329 Major depressive disorder, single episode, unspecified: Secondary | ICD-10-CM | POA: Diagnosis not present

## 2018-10-28 DIAGNOSIS — N183 Chronic kidney disease, stage 3 (moderate): Secondary | ICD-10-CM | POA: Diagnosis not present

## 2018-10-28 DIAGNOSIS — R1032 Left lower quadrant pain: Secondary | ICD-10-CM | POA: Diagnosis not present

## 2019-04-15 DIAGNOSIS — H52223 Regular astigmatism, bilateral: Secondary | ICD-10-CM | POA: Diagnosis not present

## 2019-04-24 DIAGNOSIS — Z23 Encounter for immunization: Secondary | ICD-10-CM | POA: Diagnosis not present

## 2019-06-03 DIAGNOSIS — M791 Myalgia, unspecified site: Secondary | ICD-10-CM | POA: Diagnosis not present

## 2019-06-18 DIAGNOSIS — Z0120 Encounter for dental examination and cleaning without abnormal findings: Secondary | ICD-10-CM | POA: Diagnosis not present

## 2019-10-06 DIAGNOSIS — K5792 Diverticulitis of intestine, part unspecified, without perforation or abscess without bleeding: Secondary | ICD-10-CM | POA: Diagnosis not present

## 2019-10-06 DIAGNOSIS — R102 Pelvic and perineal pain: Secondary | ICD-10-CM | POA: Diagnosis not present

## 2019-10-06 DIAGNOSIS — R1032 Left lower quadrant pain: Secondary | ICD-10-CM | POA: Diagnosis not present

## 2019-11-06 ENCOUNTER — Ambulatory Visit: Payer: Medicare Other | Attending: Internal Medicine

## 2019-12-21 DIAGNOSIS — Z0120 Encounter for dental examination and cleaning without abnormal findings: Secondary | ICD-10-CM | POA: Diagnosis not present

## 2020-02-25 DIAGNOSIS — D225 Melanocytic nevi of trunk: Secondary | ICD-10-CM | POA: Diagnosis not present

## 2020-02-25 DIAGNOSIS — L821 Other seborrheic keratosis: Secondary | ICD-10-CM | POA: Diagnosis not present

## 2020-02-25 DIAGNOSIS — L820 Inflamed seborrheic keratosis: Secondary | ICD-10-CM | POA: Diagnosis not present

## 2020-02-25 DIAGNOSIS — D2239 Melanocytic nevi of other parts of face: Secondary | ICD-10-CM | POA: Diagnosis not present

## 2020-02-26 DIAGNOSIS — I10 Essential (primary) hypertension: Secondary | ICD-10-CM | POA: Diagnosis not present

## 2020-02-26 DIAGNOSIS — N1831 Chronic kidney disease, stage 3a: Secondary | ICD-10-CM | POA: Diagnosis not present

## 2020-02-26 DIAGNOSIS — I700 Atherosclerosis of aorta: Secondary | ICD-10-CM | POA: Diagnosis not present

## 2020-02-26 DIAGNOSIS — R7301 Impaired fasting glucose: Secondary | ICD-10-CM | POA: Diagnosis not present

## 2020-03-09 DIAGNOSIS — H25013 Cortical age-related cataract, bilateral: Secondary | ICD-10-CM | POA: Diagnosis not present

## 2020-03-09 DIAGNOSIS — H35372 Puckering of macula, left eye: Secondary | ICD-10-CM | POA: Diagnosis not present

## 2020-03-09 DIAGNOSIS — H2513 Age-related nuclear cataract, bilateral: Secondary | ICD-10-CM | POA: Diagnosis not present

## 2020-06-22 DIAGNOSIS — Z23 Encounter for immunization: Secondary | ICD-10-CM | POA: Diagnosis not present

## 2020-07-04 DIAGNOSIS — M7502 Adhesive capsulitis of left shoulder: Secondary | ICD-10-CM | POA: Diagnosis not present

## 2020-07-04 DIAGNOSIS — M7501 Adhesive capsulitis of right shoulder: Secondary | ICD-10-CM | POA: Diagnosis not present

## 2020-07-29 DIAGNOSIS — M19012 Primary osteoarthritis, left shoulder: Secondary | ICD-10-CM | POA: Diagnosis not present

## 2020-07-29 DIAGNOSIS — M19011 Primary osteoarthritis, right shoulder: Secondary | ICD-10-CM | POA: Diagnosis not present

## 2020-08-08 NOTE — Patient Instructions (Addendum)
DUE TO COVID-19 ONLY ONE VISITOR IS ALLOWED TO COME WITH YOU AND STAY IN THE WAITING ROOM ONLY DURING PRE OP AND PROCEDURE DAY OF SURGERY. THE 1 VISITOR  MAY VISIT WITH YOU AFTER SURGERY IN YOUR PRIVATE ROOM DURING VISITING HOURS ONLY!  YOU NEED TO HAVE A COVID 19 TEST ON: 08/15/20 @  9:00 AM , THIS TEST MUST BE DONE BEFORE SURGERY,  COVID TESTING SITE [place] [address], IT IS ON THE RIGHT GOING OUT [address] APPROXIMATELY  2 MINUTES PAST [employer] ON THE RIGHT. ONCE YOUR COVID TEST IS COMPLETED,  PLEASE BEGIN THE QUARANTINE INSTRUCTIONS AS OUTLINED IN YOUR HANDOUT.                Sandy [name]    Your procedure is scheduled on: 08/18/20   Report to [facility] Main  Entrance   Report to admitting at: 7:30 AM     Call this number if you have problems the morning of surgery [phone]    Remember: Do not eat food or drink liquids :After Midnight.   BRUSH YOUR TEETH MORNING OF SURGERY AND RINSE YOUR MOUTH OUT, NO CHEWING GUM CANDY OR MINTS.   Take these medicines the morning of surgery with A SIP OF WATER: lansoprazole.Use clonazepam as needed.                               You may not have any metal on your body including hair pins and              piercings  Do not wear jewelry, make-up, lotions, powders or perfumes, deodorant             Do not wear nail polish on your fingernails.  Do not shave  48 hours prior to surgery.            Do not bring valuables to the hospital. [place].  Contacts, dentures or bridgework may not be worn into surgery.  Leave suitcase in the car. After surgery it may be brought to your room.     Patients discharged the day of surgery will not be allowed to drive home. IF YOU ARE HAVING SURGERY AND GOING HOME THE SAME DAY, YOU MUST HAVE AN ADULT TO DRIVE YOU HOME AND BE WITH YOU FOR 24 HOURS. YOU MAY GO HOME BY TAXI OR UBER OR ORTHERWISE, BUT AN  ADULT MUST ACCOMPANY YOU HOME AND STAY WITH YOU FOR 24 HOURS.  Name and phone number of your driver:  Special Instructions: N/A              Please read over the following fact sheets you were given: _____________________________________________________________________  [facility]- Preparing for Total Shoulder Arthroplasty    Before surgery, you can play an important role. Because skin is not sterile, your skin needs to be as free of germs as possible. You can reduce the number of germs on your skin by using the following products. . Benzoyl Peroxide Gel o Reduces the number of germs present on the skin o Applied twice a day to shoulder area starting two days before surgery    ==================================================================  Please follow these instructions carefully:  BENZOYL PEROXIDE 5% GEL  Please do not use if you have an allergy to benzoyl peroxide.  If your skin becomes reddened/irritated stop using the benzoyl peroxide.  Starting two days before surgery, apply as follows: 1. Apply benzoyl peroxide in the morning and at night. Apply after taking a shower. If you are not taking a shower clean entire shoulder front, back, and side along with the armpit with a clean wet washcloth.  2. Place a quarter-sized dollop on your shoulder and rub in thoroughly, making sure to cover the front, back, and side of your shoulder, along with the armpit.   2 days before ____ AM   ____ PM              1 day before ____ AM   ____ PM                         3. Do this twice a day for two days.  (Last application is the night before surgery, AFTER using the CHG soap as described below).  4. Do NOT apply benzoyl peroxide gel on the day of surgery.         [facility] - Preparing for Surgery Before surgery, you can play an important role.  Because skin is not sterile, your skin needs to be as free of germs as possible.  You can reduce the number of germs on your skin by washing  with CHG (chlorahexidine gluconate) soap before surgery.  CHG is an antiseptic cleaner which kills germs and bonds with the skin to continue killing germs even after washing. Please DO NOT use if you have an allergy to CHG or antibacterial soaps.  If your skin becomes reddened/irritated stop using the CHG and inform your nurse when you arrive at Short Stay. Do not shave (including legs and underarms) for at least 48 hours prior to the first CHG shower.  You may shave your face/neck. Please follow these instructions carefully:  1.  Shower with CHG Soap the night before surgery and the  morning of Surgery.  2.  If you choose to wash your hair, wash your hair first as usual with your  normal  shampoo.  3.  After you shampoo, rinse your hair and body thoroughly to remove the  shampoo.                           4.  Use CHG as you would any other liquid soap.  You can apply chg directly  to the skin and wash                       Gently with a scrungie or clean washcloth.  5.  Apply the CHG Soap to your body ONLY FROM THE NECK DOWN.   Do not use on face/ open                           Wound or open sores. Avoid contact with eyes, ears mouth and genitals (private parts).                       Wash face,  Genitals (private parts) with your normal soap.             6.  Wash thoroughly, paying special attention to the area where your surgery  will be performed.  7.  Thoroughly rinse your body with warm water from the neck down.  8.  DO NOT shower/wash with your normal soap after using and rinsing off  the CHG Soap.                9.  Pat yourself dry with a clean towel.            10.  Wear clean pajamas.            11.  Place clean sheets on your bed the night of your first shower and do not  sleep with pets. Day of Surgery : Do not apply any lotions/deodorants the morning of surgery.  Please wear clean clothes to the hospital/surgery center.  FAILURE TO FOLLOW THESE INSTRUCTIONS MAY RESULT IN THE  CANCELLATION OF YOUR SURGERY PATIENT SIGNATURE_________________________________  NURSE SIGNATURE__________________________________  ________________________________________________________________________

## 2020-08-08 NOTE — Progress Notes (Signed)
Pt. Needs orders for upcomming surgery. PAT and labs appointment on 08/09/20.Thanks.

## 2020-08-09 ENCOUNTER — Encounter (HOSPITAL_COMMUNITY): Payer: Self-pay

## 2020-08-09 ENCOUNTER — Encounter (HOSPITAL_COMMUNITY)
Admission: RE | Admit: 2020-08-09 | Discharge: 2020-08-09 | Disposition: A | Discharge status: Home / Self Care | Payer: Medicare Other | Source: Ambulatory Visit | Attending: Orthopedic Surgery | Admitting: Orthopedic Surgery

## 2020-08-09 ENCOUNTER — Other Ambulatory Visit: Payer: Self-pay

## 2020-08-09 DIAGNOSIS — Z01818 Encounter for other preprocedural examination: Secondary | ICD-10-CM | POA: Insufficient documentation

## 2020-08-09 HISTORY — DX: Anxiety disorder, unspecified: F41.9

## 2020-08-09 HISTORY — DX: Unspecified osteoarthritis, unspecified site: M19.90

## 2020-08-09 LAB — CBC
HCT: 42.8 % (ref 36.0–46.0)
Hemoglobin: 14.1 g/dL (ref 12.0–15.0)
MCH: 31.8 pg (ref 26.0–34.0)
MCHC: 32.9 g/dL (ref 30.0–36.0)
MCV: 96.4 fL (ref 80.0–100.0)
Platelets: 199 10*3/uL (ref 150–400)
RBC: 4.44 MIL/uL (ref 3.87–5.11)
RDW: 11.9 % (ref 11.5–15.5)
WBC: 7.3 10*3/uL (ref 4.0–10.5)
nRBC: 0 % (ref 0.0–0.2)

## 2020-08-09 LAB — BASIC METABOLIC PANEL
Anion gap: 9 (ref 5–15)
BUN: 19 mg/dL (ref 8–23)
CO2: 28 mmol/L (ref 22–32)
Calcium: 9.4 mg/dL (ref 8.9–10.3)
Chloride: 100 mmol/L (ref 98–111)
Creatinine, Ser: 0.66 mg/dL (ref 0.44–1.00)
GFR, Estimated: >60 mL/min (ref >60)
Glucose, Bld: 113 mg/dL — ABNORMAL HIGH (ref 70–99)
Potassium: 4.1 mmol/L (ref 3.5–5.1)
Sodium: 137 mmol/L (ref 135–145)

## 2020-08-09 LAB — SURGICAL PCR SCREEN
MRSA, PCR: NEGATIVE
Staphylococcus aureus: NEGATIVE

## 2020-08-09 NOTE — Progress Notes (Signed)
COVID Vaccine Completed: Yes Date COVID Vaccine completed: 07/02/20 [place] COVID vaccine manufacturer:     Moderna      PCP - Dr. Reynold Bowen. Cardiologist -   Chest x-ray -  EKG -  Stress Test -  ECHO -  Cardiac Cath -  Pacemaker/ICD device last checked:  Sleep Study -  CPAP -   Fasting Blood Sugar -  Checks Blood Sugar _____ times a day  Blood Thinner Instructions: Aspirin Instructions: Last Dose:  Anesthesia review: Hx: HTN.  Patient denies shortness of breath, fever, cough and chest pain at PAT appointment   Patient verbalized understanding of instructions that were given to them at the PAT appointment. Patient was also instructed that they will need to review over the PAT instructions again at home before surgery.

## 2020-08-15 ENCOUNTER — Other Ambulatory Visit (HOSPITAL_COMMUNITY)
Admission: RE | Admit: 2020-08-15 | Discharge: 2020-08-15 | Disposition: A | Discharge status: Home / Self Care | Payer: Medicare Other | Source: Ambulatory Visit | Attending: Orthopedic Surgery | Admitting: Orthopedic Surgery

## 2020-08-15 DIAGNOSIS — Z20822 Contact with and (suspected) exposure to covid-19: Secondary | ICD-10-CM | POA: Insufficient documentation

## 2020-08-15 DIAGNOSIS — Z01812 Encounter for preprocedural laboratory examination: Secondary | ICD-10-CM | POA: Diagnosis not present

## 2020-08-15 LAB — SARS CORONAVIRUS 2 (TAT 6-24 HRS): SARS Coronavirus 2: NEGATIVE

## 2020-08-17 ENCOUNTER — Encounter (HOSPITAL_COMMUNITY): Payer: Self-pay | Admitting: Orthopedic Surgery

## 2020-08-18 ENCOUNTER — Encounter (HOSPITAL_COMMUNITY)
Admission: RE | Disposition: A | Discharge status: Home / Self Care | Payer: Self-pay | Source: Home / Self Care | Attending: Orthopedic Surgery

## 2020-08-18 ENCOUNTER — Ambulatory Visit (HOSPITAL_COMMUNITY)
Admission: RE | Admit: 2020-08-18 | Discharge: 2020-08-18 | Disposition: A | Discharge status: Home / Self Care | Payer: Medicare Other | Attending: Orthopedic Surgery | Admitting: Orthopedic Surgery

## 2020-08-18 ENCOUNTER — Ambulatory Visit (HOSPITAL_COMMUNITY): Payer: Medicare Other | Admitting: Anesthesiology

## 2020-08-18 ENCOUNTER — Encounter (HOSPITAL_COMMUNITY): Payer: Self-pay | Admitting: Orthopedic Surgery

## 2020-08-18 DIAGNOSIS — Z800 Family history of malignant neoplasm of digestive organs: Secondary | ICD-10-CM | POA: Insufficient documentation

## 2020-08-18 DIAGNOSIS — K760 Fatty (change of) liver, not elsewhere classified: Secondary | ICD-10-CM | POA: Insufficient documentation

## 2020-08-18 DIAGNOSIS — M75102 Unspecified rotator cuff tear or rupture of left shoulder, not specified as traumatic: Secondary | ICD-10-CM | POA: Insufficient documentation

## 2020-08-18 DIAGNOSIS — I10 Essential (primary) hypertension: Secondary | ICD-10-CM | POA: Insufficient documentation

## 2020-08-18 DIAGNOSIS — M19012 Primary osteoarthritis, left shoulder: Secondary | ICD-10-CM | POA: Diagnosis not present

## 2020-08-18 DIAGNOSIS — G8918 Other acute postprocedural pain: Secondary | ICD-10-CM | POA: Diagnosis not present

## 2020-08-18 DIAGNOSIS — Z79899 Other long term (current) drug therapy: Secondary | ICD-10-CM | POA: Insufficient documentation

## 2020-08-18 DIAGNOSIS — Z8249 Family history of ischemic heart disease and other diseases of the circulatory system: Secondary | ICD-10-CM | POA: Diagnosis not present

## 2020-08-18 DIAGNOSIS — I272 Pulmonary hypertension, unspecified: Secondary | ICD-10-CM | POA: Insufficient documentation

## 2020-08-18 DIAGNOSIS — Z825 Family history of asthma and other chronic lower respiratory diseases: Secondary | ICD-10-CM | POA: Insufficient documentation

## 2020-08-18 DIAGNOSIS — Z791 Long term (current) use of non-steroidal anti-inflammatories (NSAID): Secondary | ICD-10-CM | POA: Insufficient documentation

## 2020-08-18 DIAGNOSIS — M19011 Primary osteoarthritis, right shoulder: Secondary | ICD-10-CM | POA: Insufficient documentation

## 2020-08-18 DIAGNOSIS — K219 Gastro-esophageal reflux disease without esophagitis: Secondary | ICD-10-CM | POA: Diagnosis not present

## 2020-08-18 DIAGNOSIS — M75101 Unspecified rotator cuff tear or rupture of right shoulder, not specified as traumatic: Secondary | ICD-10-CM | POA: Diagnosis not present

## 2020-08-18 HISTORY — PX: REVERSE SHOULDER ARTHROPLASTY: SHX5054

## 2020-08-18 SURGERY — ARTHROPLASTY, SHOULDER, TOTAL, REVERSE
Anesthesia: Regional | Site: Shoulder | Laterality: Left

## 2020-08-18 MED ORDER — EPHEDRINE SULFATE-NACL 50-0.9 MG/10ML-% IV SOSY
PREFILLED_SYRINGE | INTRAVENOUS | Status: DC | PRN
  Administered 2020-08-18: 5 mg via INTRAVENOUS

## 2020-08-18 MED ORDER — OXYCODONE HCL 5 MG PO TABS: 5.0000 mg | ORAL_TABLET | ORAL | 0 refills | Status: DC | PRN

## 2020-08-18 MED ORDER — LACTATED RINGERS IV BOLUS
500.0000 mL | Freq: Once | INTRAVENOUS | Status: AC
  Administered 2020-08-18: 500 mL via INTRAVENOUS

## 2020-08-18 MED ORDER — ONDANSETRON HCL 4 MG PO TABS: 4.0000 mg | ORAL_TABLET | Freq: Three times a day (TID) | ORAL | 0 refills | Status: DC | PRN

## 2020-08-18 MED ORDER — BUPIVACAINE LIPOSOME 1.3 % IJ SUSP
INTRAMUSCULAR | Status: DC | PRN
  Administered 2020-08-18: 10 mL via PERINEURAL

## 2020-08-18 MED ORDER — DEXAMETHASONE SODIUM PHOSPHATE 10 MG/ML IJ SOLN
INTRAMUSCULAR | Status: DC | PRN
  Administered 2020-08-18: 4 mg via INTRAVENOUS

## 2020-08-18 MED ORDER — 0.9 % SODIUM CHLORIDE (POUR BTL) OPTIME
TOPICAL | Status: DC | PRN
  Administered 2020-08-18: 1000 mL

## 2020-08-18 MED ORDER — FENTANYL CITRATE (PF) 100 MCG/2ML IJ SOLN
50.0000 ug | INTRAMUSCULAR | Status: DC
  Administered 2020-08-18: 100 ug via INTRAVENOUS
  Filled 2020-08-18: qty 2

## 2020-08-18 MED ORDER — ORAL CARE MOUTH RINSE: 15.0000 mL | Freq: Once | OROMUCOSAL | Status: AC

## 2020-08-18 MED ORDER — LIP MEDEX EX OINT
TOPICAL_OINTMENT | CUTANEOUS | Status: AC
  Filled 2020-08-18: qty 7

## 2020-08-18 MED ORDER — PHENYLEPHRINE 40 MCG/ML (10ML) SYRINGE FOR IV PUSH (FOR BLOOD PRESSURE SUPPORT)
PREFILLED_SYRINGE | INTRAVENOUS | Status: DC | PRN
  Administered 2020-08-18: 120 ug via INTRAVENOUS

## 2020-08-18 MED ORDER — CHLORHEXIDINE GLUCONATE 0.12 % MT SOLN
15.0000 mL | Freq: Once | OROMUCOSAL | Status: AC
  Administered 2020-08-18: 15 mL via OROMUCOSAL

## 2020-08-18 MED ORDER — SUGAMMADEX SODIUM 200 MG/2ML IV SOLN
INTRAVENOUS | Status: DC | PRN
  Administered 2020-08-18: 200 mg via INTRAVENOUS
  Administered 2020-08-18: 100 mg via INTRAVENOUS

## 2020-08-18 MED ORDER — VANCOMYCIN HCL 1000 MG IV SOLR
INTRAVENOUS | Status: AC
  Filled 2020-08-18: qty 1000

## 2020-08-18 MED ORDER — EPHEDRINE SULFATE-NACL 50-0.9 MG/10ML-% IV SOSY
PREFILLED_SYRINGE | INTRAVENOUS | Status: DC | PRN
  Administered 2020-08-18: 5 mg via INTRAVENOUS
  Administered 2020-08-18: 10 mg via INTRAVENOUS

## 2020-08-18 MED ORDER — TRAMADOL HCL 50 MG PO TABS: 50.0000 mg | ORAL_TABLET | Freq: Four times a day (QID) | ORAL | 0 refills | Status: DC | PRN

## 2020-08-18 MED ORDER — STERILE WATER FOR IRRIGATION IR SOLN
Status: DC | PRN
  Administered 2020-08-18: 2000 mL

## 2020-08-18 MED ORDER — FENTANYL CITRATE (PF) 100 MCG/2ML IJ SOLN
INTRAMUSCULAR | Status: DC | PRN
  Administered 2020-08-18: 50 ug via INTRAVENOUS

## 2020-08-18 MED ORDER — ROCURONIUM BROMIDE 10 MG/ML (PF) SYRINGE
PREFILLED_SYRINGE | INTRAVENOUS | Status: DC | PRN
  Administered 2020-08-18: 60 mg via INTRAVENOUS

## 2020-08-18 MED ORDER — PHENYLEPHRINE HCL-NACL 10-0.9 MG/250ML-% IV SOLN
INTRAVENOUS | Status: DC | PRN
  Administered 2020-08-18: 20 ug/min via INTRAVENOUS

## 2020-08-18 MED ORDER — TRANEXAMIC ACID-NACL 1000-0.7 MG/100ML-% IV SOLN
1000.0000 mg | INTRAVENOUS | Status: AC
  Administered 2020-08-18: 1000 mg via INTRAVENOUS
  Filled 2020-08-18: qty 100

## 2020-08-18 MED ORDER — METHOCARBAMOL 500 MG PO TABS: 500.0000 mg | ORAL_TABLET | Freq: Three times a day (TID) | ORAL | 1 refills | Status: DC | PRN

## 2020-08-18 MED ORDER — VANCOMYCIN HCL 1000 MG IV SOLR
INTRAVENOUS | Status: DC | PRN
  Administered 2020-08-18: 1000 mg via TOPICAL

## 2020-08-18 MED ORDER — ONDANSETRON HCL 4 MG/2ML IJ SOLN
INTRAMUSCULAR | Status: DC | PRN
  Administered 2020-08-18: 4 mg via INTRAVENOUS

## 2020-08-18 MED ORDER — PROPOFOL 10 MG/ML IV BOLUS
INTRAVENOUS | Status: DC | PRN
  Administered 2020-08-18: 120 mg via INTRAVENOUS

## 2020-08-18 MED ORDER — LACTATED RINGERS IV SOLN: INTRAVENOUS | Status: DC

## 2020-08-18 MED ORDER — BUPIVACAINE HCL (PF) 0.5 % IJ SOLN
INTRAMUSCULAR | Status: DC | PRN
  Administered 2020-08-18: 15 mL via PERINEURAL

## 2020-08-18 MED ORDER — LACTATED RINGERS IV BOLUS
250.0000 mL | Freq: Once | INTRAVENOUS | Status: AC
  Administered 2020-08-18: 250 mL via INTRAVENOUS

## 2020-08-18 MED ORDER — ROCURONIUM BROMIDE 10 MG/ML (PF) SYRINGE
PREFILLED_SYRINGE | INTRAVENOUS | Status: AC
  Filled 2020-08-18: qty 20

## 2020-08-18 MED ORDER — EPHEDRINE 5 MG/ML INJ
INTRAVENOUS | Status: AC
  Filled 2020-08-18: qty 10

## 2020-08-18 MED ORDER — CEFAZOLIN SODIUM-DEXTROSE 2-4 GM/100ML-% IV SOLN
2.0000 g | INTRAVENOUS | Status: AC
  Administered 2020-08-18: 2 g via INTRAVENOUS
  Filled 2020-08-18: qty 100

## 2020-08-18 MED ORDER — FENTANYL CITRATE (PF) 100 MCG/2ML IJ SOLN
25.0000 ug | INTRAMUSCULAR | Status: DC | PRN
Start: 2020-08-18 — End: 2020-08-18

## 2020-08-18 MED ORDER — FENTANYL CITRATE (PF) 100 MCG/2ML IJ SOLN
INTRAMUSCULAR | Status: AC
  Filled 2020-08-18: qty 2

## 2020-08-18 MED ORDER — ACETAMINOPHEN 500 MG PO TABS
1000.0000 mg | ORAL_TABLET | Freq: Once | ORAL | Status: AC
  Administered 2020-08-18: 1000 mg via ORAL
  Filled 2020-08-18: qty 2

## 2020-08-18 SURGICAL SUPPLY — 73 items
ADH SKN CLS APL DERMABOND .7 (GAUZE/BANDAGES/DRESSINGS) ×1
AID PSTN UNV HD RSTRNT DISP (MISCELLANEOUS) ×1
BAG SPEC THK2 15X12 ZIP CLS (MISCELLANEOUS) ×1
BAG ZIPLOCK 12X15 (MISCELLANEOUS) ×2 IMPLANT
BLADE SAW SGTL 83.5X18.5 (BLADE) ×2 IMPLANT
BSPLAT GLND +2X24 MDLR (Joint) ×1 IMPLANT
COOLER ICEMAN CLASSIC (MISCELLANEOUS) IMPLANT
COVER BACK TABLE 60X90IN (DRAPES) ×2 IMPLANT
COVER SURGICAL LIGHT HANDLE (MISCELLANEOUS) ×2 IMPLANT
COVER WAND RF STERILE (DRAPES) IMPLANT
CUP SUT UNIV REVERS 36 NEUTRAL (Cup) ×1 IMPLANT
DERMABOND ADVANCED (GAUZE/BANDAGES/DRESSINGS) ×1
DERMABOND ADVANCED .7 DNX12 (GAUZE/BANDAGES/DRESSINGS) ×1 IMPLANT
DRAPE INCISE IOBAN 66X45 STRL (DRAPES) IMPLANT
DRAPE ORTHO SPLIT 77X108 STRL (DRAPES) ×4
DRAPE SHEET LG 3/4 BI-LAMINATE (DRAPES) ×2 IMPLANT
DRAPE SURG 17X11 SM STRL (DRAPES) ×2 IMPLANT
DRAPE SURG ORHT 6 SPLT 77X108 (DRAPES) ×2 IMPLANT
DRAPE U-SHAPE 47X51 STRL (DRAPES) ×2 IMPLANT
DRSG AQUACEL AG ADV 3.5X 6 (GAUZE/BANDAGES/DRESSINGS) ×1 IMPLANT
DRSG AQUACEL AG ADV 3.5X10 (GAUZE/BANDAGES/DRESSINGS) ×2 IMPLANT
DURAPREP 26ML APPLICATOR (WOUND CARE) ×2 IMPLANT
ELECT BLADE TIP CTD 4 INCH (ELECTRODE) ×2 IMPLANT
ELECT REM PT RETURN 15FT ADLT (MISCELLANEOUS) ×2 IMPLANT
FACESHIELD WRAPAROUND (MASK) ×8 IMPLANT
GLENOID UNI REV MOD 24 +2 LAT (Joint) ×1 IMPLANT
GLENOSPHERE 36 +4 LAT/24 (Joint) ×2 IMPLANT
GLOVE BIO SURGEON STRL SZ7.5 (GLOVE) ×2 IMPLANT
GLOVE BIO SURGEON STRL SZ8 (GLOVE) ×2 IMPLANT
GLOVE SS BIOGEL STRL SZ 7 (GLOVE) ×1 IMPLANT
GLOVE SS BIOGEL STRL SZ 7.5 (GLOVE) ×1 IMPLANT
GLOVE SUPERSENSE BIOGEL SZ 7 (GLOVE) ×1
GLOVE SUPERSENSE BIOGEL SZ 7.5 (GLOVE) ×1
GLOVE SURG SYN 7.0 (GLOVE) IMPLANT
GLOVE SURG SYN 7.0 PF PI (GLOVE) IMPLANT
GLOVE SURG SYN 7.5  E (GLOVE)
GLOVE SURG SYN 7.5 E (GLOVE) IMPLANT
GLOVE SURG SYN 8.0 (GLOVE) IMPLANT
GLOVE SURG SYN 8.0 PF PI (GLOVE) IMPLANT
GOWN STRL REUS W/TWL LRG LVL3 (GOWN DISPOSABLE) ×4 IMPLANT
INSERT HUMERAL 36 +6 (Shoulder) ×1 IMPLANT
KIT BASIN OR (CUSTOM PROCEDURE TRAY) ×2 IMPLANT
KIT TURNOVER KIT A (KITS) IMPLANT
MANIFOLD NEPTUNE II (INSTRUMENTS) ×2 IMPLANT
NDL TAPERED W/ NITINOL LOOP (MISCELLANEOUS) ×1 IMPLANT
NEEDLE TAPERED W/ NITINOL LOOP (MISCELLANEOUS) ×2 IMPLANT
NS IRRIG 1000ML POUR BTL (IV SOLUTION) ×2 IMPLANT
PACK SHOULDER (CUSTOM PROCEDURE TRAY) ×2 IMPLANT
PAD ARMBOARD 7.5X6 YLW CONV (MISCELLANEOUS) ×2 IMPLANT
PAD COLD SHLDR WRAP-ON (PAD) IMPLANT
PIN NITINOL TARGETER 2.8 (PIN) IMPLANT
PIN SET MODULAR GLENOID SYSTEM (PIN) IMPLANT
RESTRAINT HEAD UNIVERSAL NS (MISCELLANEOUS) ×2 IMPLANT
SCREW CENTRAL MODULAR 25 (Screw) ×1 IMPLANT
SCREW PERI LOCK 5.5X16 (Screw) ×2 IMPLANT
SCREW PERI LOCK 5.5X24 (Screw) ×1 IMPLANT
SCREW PERIPHERAL 5.5X28 LOCK (Screw) ×1 IMPLANT
SLING ARM FOAM STRAP LRG (SOFTGOODS) IMPLANT
SLING ARM FOAM STRAP MED (SOFTGOODS) ×1 IMPLANT
SPONGE LAP 18X18 RF (DISPOSABLE) IMPLANT
STEM HUMERAL UNIVERS SZ8 (Stem) ×1 IMPLANT
SUCTION FRAZIER HANDLE 12FR (TUBING) ×2
SUCTION TUBE FRAZIER 12FR DISP (TUBING) ×1 IMPLANT
SUT FIBERWIRE #2 38 T-5 BLUE (SUTURE)
SUT MNCRL AB 3-0 PS2 18 (SUTURE) ×2 IMPLANT
SUT MON AB 2-0 CT1 36 (SUTURE) ×2 IMPLANT
SUT VIC AB 1 CT1 36 (SUTURE) ×2 IMPLANT
SUTURE FIBERWR #2 38 T-5 BLUE (SUTURE) IMPLANT
SUTURE TAPE 1.3 40 TPR END (SUTURE) ×2 IMPLANT
SUTURETAPE 1.3 40 TPR END (SUTURE) ×4
TOWEL OR 17X26 10 PK STRL BLUE (TOWEL DISPOSABLE) ×2 IMPLANT
TOWEL OR NON WOVEN STRL DISP B (DISPOSABLE) ×2 IMPLANT
WATER STERILE IRR 1000ML POUR (IV SOLUTION) ×4 IMPLANT

## 2020-08-18 NOTE — Op Note (Signed)
08/18/2020  11:29 AM  PATIENT:   Sandy [name]  86 y.o. 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  ASSISTANTS: Jenetta Loges, PA-C  ANESTHESIA:   General endotracheal and interscalene block with Exparel  EBL: 100 cc  SPECIMEN: None  Drains: None   PATIENT DISPOSITION:  PACU - hemodynamically stable.    PLAN OF CARE: Discharge to home after PACU  Brief history:  Patient is an 86 year old female with a long history of progressively increasing bilateral shoulder pain related to severe osteoarthritis.  Her left shoulder is much more symptomatic than the right.  Due to her increasing functional imitations and failure to respond to conservative management she is brought to the operating room this time for planned left shoulder reverse arthroplasty.  Preoperatively, I counseled the patient regarding treatment options and risks versus benefits thereof.  Possible surgical complications were all reviewed including potential for bleeding, infection, neurovascular injury, persistent pain, loss of motion, anesthetic complication, failure of the implant, and possible need for additional surgery. They understand and accept and agrees with our planned procedure.   Procedure in detail:  After undergoing routine preop evaluation the patient received prophylactic antibiotics and interscalene block with Exparel was established in the holding area by the anesthesia department.  Patient subsequently placed supine on the operating table and underwent the smooth induction of general endotracheal anesthesia.  Placed into the beachchair position and appropriately padded and protected.  The left shoulder  girdle region was sterilely prepped and draped in standard fashion.  Timeout was called.  An anterior deltopectoral approach left shoulder is made through an 8 cm incision.  Skin flaps were elevated and dissection was carried deeply and the deltopectoral interval was developed from proximal to distal with the vein taken laterally.  The upper centimeter of the pectoralis major was tenotomized for exposure and the conjoined tendon was mobilized and retracted medially.  The long head biceps tendon was then tenodesed at the upper border of the pectoralis major tendon the proximal segment being unroofed and excised.  The rotator cuff was split along the rotator interval to the base of the coracoid and the subscap was then divided from left tuberosity and tagged with a pair of suture tape sutures.  Capsular attachments on the anterior and inferior margins of the humeral neck were then elevated in a subperiosteal fashion allowing deliver the head through the wound.  Extra medullary guide was then used to outline the proposed humeral head resection which was performed with an oscillating saw at the native retroversion of approximate 25 degrees.  Peripheral osteophytes were removed with rondure and a metal cap was placed over the cut proximal humeral surface.  The glenoid was then exposed and a circumferential labral excision was completed.  Once the periphery the glenoid was completely visualized a guidepin was then directed into the center of the glenoid with an approximately 5 degree inferior tilt and the glenoid was then reamed with our central followed by the peripheral reamer to a stable subchondral bony bed.  All bony and soft tissue debris was carefully removed.  Glenoid was then terminally prepared with the central drill and tap and a 25 mm lag screw was selected.  The baseplate was assembled with vancomycin powder placed  around the lag screw and it was inserted with excellent fit and fixation.  The peripheral  locking screws were all then placed using standard technique with vancomycin powder applied to the threads.  Excellent fixation was achieved.  The 36/+4 glenosphere was then impacted onto the baseplate and the central locking screw was placed.  We returned our attention to the proximal humerus where the canal was opened hand reamed to size 7 broaching to size 8 and then metaphyseal reamer was utilized in the neutral position.  A trial implant was placed in the showed good soft tissue balance good motion good stability.  Our final implant was assembled on the back table and the stem was then coated with vancomycin powder and vancomycin powder was instilled into the humeral canal and the soft tissues about the incision.  Our implant was then impacted with excellent fit and fixation.  Trial reductions at this point showed the +6 poly to give [place] the best soft tissue balance good motion good stability.  The trial was removed the final +6 polywas then impacted into the implant.  Finally implant was then reduced with excellent motion good stability good soft tissue balance.  The wound was then copes irrigated.  Final hemostasis was obtained.  We confirmed good mobility of the subscap and was then repaired back to the eyelets on the collar of her implant using the previously placed suture tape sutures.  The arm easily achieved 45 degrees of external rotation without excessive tension on the subscap repair.  Final irrigation was completed.  The balance of the vancomycin powder was then spread liberally about the soft tissue planes.  The deltopectoral interval was closed with a series of figure-of-eight #1 Vicryl sutures.  2-0 Vicryl used for subcu layer and intracuticular 3-0 Monocryl for the skin followed by Dermabond and Aquacel dressing.  Left arm was then placed into a sling and the patient was then awakened, extubated, and taken to recovery room in stable condition.  Jenetta Loges, PA-C was utilized as an Environmental consultant  throughout this case, essential for help with positioning the patient, positioning extremity, tissue manipulation, implantation of the prosthesis, suture management, wound closure, and intraoperative decision-making.  Marin Shutter MD   Contact # [phone]

## 2020-08-18 NOTE — Discharge Instructions (Signed)
 Kevin M. Supple, M.D., F.A.A.O.S. Orthopaedic Surgery Specializing in Arthroscopic and Reconstructive Surgery of the Shoulder [phone] [address] - [place], [place] [address] - Fax [phone]   POST-OP TOTAL SHOULDER REPLACEMENT INSTRUCTIONS  1. Follow up in the office for your first post-op appointment 10-14 days from the date of your surgery. If you do not already have a scheduled appointment, our office will contact you to schedule.  2. The bandage over your incision is waterproof. You may begin showering with this dressing on. You may leave this dressing on until first follow up appointment within 2 weeks. We prefer you leave this dressing in place until follow up however after 5-7 days if you are having itching or skin irritation and would like to remove it you may do so. Go slow and tug at the borders gently to break the bond the dressing has with the skin. At this point if there is no drainage it is okay to go without a bandage or you may cover it with a light guaze and tape. You can also expect significant bruising around your shoulder that will drift down your arm and into your chest wall. This is very normal and should resolve over several days.   3. Wear your sling/immobilizer at all times except to perform the exercises below or to occasionally let your arm dangle by your side to stretch your elbow. You also need to sleep in your sling immobilizer until instructed otherwise. It is ok to remove your sling if you are sitting in a controlled environment and allow your arm to rest in a position of comfort by your side or on your lap with pillows to give your neck and skin a break from the sling. You may remove it to allow arm to dangle by side to shower. If you are up walking around and when you go to sleep at night you need to wear it.  4. Range of motion to your elbow, wrist, and hand are encouraged 3-5 times daily. Exercise to your hand and fingers helps to reduce  swelling you may experience.   5. Prescriptions for a pain medication and a muscle relaxant are provided for you. It is recommended that if you are experiencing pain that you pain medication alone is not controlling, add the muscle relaxant along with the pain medication which can give additional pain relief. The first 1-2 days is generally the most severe of your pain and then should gradually decrease. As your pain lessens it is recommended that you decrease your use of the pain medications to an "as needed basis'" only and to always comply with the recommended dosages of the pain medications.  6. Pain medications can produce constipation along with their use. If you experience this, the use of an over the counter stool softener or laxative daily is recommended.   7. For additional questions or concerns, please do not hesitate to call the office. If after hours there is an answering service to forward your concerns to the physician on call.  8.Pain control following an exparel block  To help control your post-operative pain you received a nerve block  performed with Exparel which is a long acting anesthetic (numbing agent) which can provide pain relief and sensations of numbness (and relief of pain) in the operative shoulder and arm for up to 3 days. Sometimes it provides mixed relief, meaning you may still have numbness in certain areas of the arm but can still be able to   move  parts of that arm, hand, and fingers. We recommend that your prescribed pain medications  be used as needed. We do not feel it is necessary to "pre medicate" and "stay ahead" of pain.  Taking narcotic pain medications when you are not having any pain can lead to unnecessary and potentially dangerous side effects.    9. Use the ice machine as much as possible in the first 5-7 days from surgery, then you can wean its use to as needed. The ice typically needs to be replaced every 6 hours, instead of ice you can actually freeze  water bottles to put in the cooler and then fill water around them to avoid having to purchase ice. You can have spare water bottles freezing to allow you to rotate them once they have melted. Try to have a thin shirt or light cloth or towel under the ice wrap to protect your skin.   FOR ADDITIONAL INFO ON ICE MACHINE AND INSTRUCTIONS GO TO THE WEBSITE AT  https://[email or web address]/products/donjoy/[email or web address]  10.  We recommend that you avoid any dental work or cleaning in the first 3 months following your joint replacement. This is to help minimize the possibility of infection from the bacteria in your mouth that enters your bloodstream during dental work. We also recommend that you take an antibiotic prior to your dental work for the first year after your shoulder replacement to further help reduce that risk. Please simply contact our office for antibiotics to be sent to your pharmacy prior to dental work.  11. Dental Antibiotics:  In most cases prophylactic antibiotics for Dental procdeures after total joint surgery are not necessary.  Exceptions are as follows:  1. History of prior total joint infection  2. Severely immunocompromised (Organ Transplant, cancer chemotherapy, Rheumatoid biologic meds such as Humera)  3. Poorly controlled diabetes (A1C &gt; 8.0, blood glucose over 200)  If you have one of these conditions, contact your surgeon for an antibiotic prescription, prior to your dental procedure.   POST-OP EXERCISES  Pendulum Exercises  Perform pendulum exercises while standing and bending at the waist. Support your uninvolved arm on a table or chair and allow your operated arm to hang freely. Make sure to do these exercises passively - not using you shoulder muscles. These exercises can be performed once your nerve block effects have worn off.  Repeat 20 times. Do 3 sessions per day.     

## 2020-08-18 NOTE — Evaluation (Signed)
Occupational Therapy Evaluation Patient Details Name: Sandy [name] MRN: [id number] DOB: 08-18-1933 Today's Date: 08/18/2020    History of Present Illness s/p left reverse shoulder arthroplasty   Clinical Impression   Sandy [name] is an 86 year old s/p reverse shoulder replacement without functional use of left non-dominant upper extremity secondary to effects of surgery, interscalene block and shoulder precautions. Therapist provided education and instruction to patient and daughter in regards to exercises, precautions, positioning, donning upper extremity clothing and bathing while maintaining shoulder precautions, ice and edema management and donning/doffing sling. Patient and daughter verbalized understanding and demonstrated as needed. Patient needed assistance to donn shirt and sling, socks and shoes. Patient to follow up with MD for further therapy needs.      Follow Up Recommendations  Follow surgeon's recommendation for DC plan and follow-up therapies    Equipment Recommendations  None recommended by OT    Recommendations for Other Services       Precautions / Restrictions Precautions Precautions: Shoulder Shoulder Interventions: Shoulder sling/immobilizer;At all times;Off for dressing/bathing/exercises Precaution Comments: If sitting in controlled environment, ok to come out of sling to give neck a break. Please sleep in it to protect until follow up in office.     OK to use operative arm for feeding, hygiene and ADLs.   Ok to instruct Pendulums and lap slides as exercises. Ok to use operative arm within the following parameters for ADL purposes     New ROM (1/44)   Ok for PROM, AAROM, AROM within pain tolerance and within the following ROM   ER 20   ABD 45   FE 60 Required Braces or Orthoses: Sling Restrictions Weight Bearing Restrictions: Yes LUE Weight Bearing: Non weight bearing      Mobility Bed Mobility                    Transfers                  General transfer comment: sit to stand PRN    Balance Overall balance assessment: No apparent balance deficits (not formally assessed)                                         ADL either performed or assessed with clinical judgement   ADL Overall ADL's : Needs assistance/impaired Eating/Feeding: Set up   Grooming: Set up   Upper Body Bathing: Minimal assistance   Lower Body Bathing: Modified independent   Upper Body Dressing : Moderate assistance;Sitting;Cueing for UE precautions   Lower Body Dressing: Minimal assistance;Sit to/from stand   Toilet Transfer: Lawyer and Hygiene: Min guard               Vision Patient Visual Report: No change from baseline       Perception     Praxis      Pertinent Vitals/Pain Pain Assessment: No/denies pain     Hand Dominance     Extremity/Trunk Assessment Upper Extremity Assessment Upper Extremity Assessment: LUE deficits/detail LUE Deficits / Details: Grossly functional ROM of fingers and wrist, still llimited at forearm, elbow and shoulder, limited by shoulder ROM   Lower Extremity Assessment Lower Extremity Assessment: Overall WFL for tasks assessed       Communication     Cognition Arousal/Alertness: Awake/alert Behavior During Therapy: WFL for tasks assessed/performed Overall Cognitive Status:  Within Functional Limits for tasks assessed                                     General Comments       Exercises     Shoulder Instructions      Home Living                                          Prior Functioning/Environment                   OT Problem List: Decreased strength;Decreased range of motion;Impaired UE functional use      OT Treatment/Interventions:      OT Goals(Current goals can be found in the care plan section) Acute Rehab OT Goals OT Goal Formulation: All assessment and education  complete, DC therapy  OT Frequency:     Barriers to D/C:            Co-evaluation              AM-PAC OT "6 Clicks" Daily Activity     Outcome Measure Help from another person eating meals?: A Little Help from another person taking care of personal grooming?: A Little Help from another person toileting, which includes using toliet, bedpan, or urinal?: A Little Help from another person bathing (including washing, rinsing, drying)?: A Little Help from another person to put on and taking off regular upper body clothing?: A Lot Help from another person to put on and taking off regular lower body clothing?: A Little 6 Click Score: 17   End of Session Nurse Communication:  (OT education complete)  Activity Tolerance: Patient tolerated treatment well Patient left: in chair;with call bell/phone within reach;with family/visitor present  OT Visit Diagnosis: Muscle weakness (generalized) (M62.81)                Time: 5277-8242 OT Time Calculation (min): 35 min Charges:  OT General Charges $OT Visit: 1 Visit OT Evaluation $OT Eval Low Complexity: 1 Low OT Treatments $Self Care/Home Management : 8-22 mins  Mong Neal, OTR/L Acute Care Rehab Services  Office [phone] Pager: [phone]   Lenward Chancellor 08/18/2020, 2:34 PM

## 2020-08-18 NOTE — Anesthesia Postprocedure Evaluation (Signed)
Anesthesia Post Note  Patient: Sandy [name]  Procedure(s) Performed: REVERSE SHOULDER ARTHROPLASTY (Left Shoulder)     Patient location during evaluation: PACU Anesthesia Type: Regional and General Level of consciousness: awake and alert Pain management: pain level controlled Vital Signs Assessment: post-procedure vital signs reviewed and stable Respiratory status: spontaneous breathing, nonlabored ventilation, respiratory function stable and patient connected to nasal cannula oxygen Cardiovascular status: blood pressure returned to baseline and stable Postop Assessment: no apparent nausea or vomiting Anesthetic complications: no   No complications documented.  Last Vitals:  Vitals:   08/18/20 1230 08/18/20 1245  BP: (!) 158/73 (!) 156/63  Pulse: 70 68  Resp: (!) 8 12  Temp:  (!) 36 C  SpO2: 95% 96%    Last Pain:  Vitals:   08/18/20 1245  TempSrc:   PainSc: 0-No pain                 Zachariah Pavek L Mal Asher

## 2020-08-18 NOTE — H&P (Signed)
Sandy [name]    Chief Complaint: Left shoulder osteoarthritis with rotator cuff dysfunction HPI: The patient is a 86 y.o. female with chronic and progressively increasing bilateral shoulder pain related to severe osteoarthritis.  Left shoulder is much more symptomatic and she is brought to the operating this time for planned left shoulder reverse arthroplasty  Past Medical History:  Diagnosis Date  . Anxiety   . Arthritis   . Barrett's esophagus   . Colon polyps   . Depression   . Diverticulitis   . Diverticulosis   . Hepatic steatosis   . Hiatal hernia   . Hyperlipidemia   . Hypertension   . Ovarian cyst   . Pulmonary hypertension ([place])     Past Surgical History:  Procedure Laterality Date  . BREAST SURGERY     for benign tumor  . CHOLECYSTECTOMY N/A 07/24/2018   Procedure: LAPAROSCOPIC CHOLECYSTECTOMY;  Surgeon: Judeth Horn, MD;  Location: [facility] OR;  Service: General;  Laterality: N/A;    Family History  Problem Relation Age of Onset  . COPD Mother   . Liver cancer Mother   . Heart disease Maternal Aunt   . Heart disease Paternal Grandfather   . CAD Father     Social History:  reports that she has never smoked. She has never used smokeless tobacco. She reports that she does not drink alcohol and does not use drugs.   Medications Prior to Admission  Medication Sig Dispense Refill  . acetaminophen (TYLENOL) 325 MG tablet Take 2 tablets (650 mg total) by mouth every 6 (six) hours as needed. (Patient taking differently: Take 650 mg by mouth every 6 (six) hours as needed for moderate pain.)    . Biotin 1000 MCG CHEW Chew 2 each by mouth daily.    . clonazePAM (KLONOPIN) 0.5 MG tablet Take 0.5 mg by mouth 3 (three) times daily as needed for anxiety.     . ergocalciferol (VITAMIN D2) 50000 units capsule Take 50,000 Units by mouth every Friday.     Marland Kitchen ibuprofen (ADVIL,MOTRIN) 200 MG tablet Take 400 mg by mouth every 6 (six) hours as needed (pain).    Marland Kitchen lansoprazole  (PREVACID) 30 MG capsule Take 30 mg by mouth every morning.     Marland Kitchen losartan (COZAAR) 100 MG tablet Take 100 mg by mouth daily.     . methocarbamol (ROBAXIN) 500 MG tablet Take 500 mg by mouth 2 (two) times daily as needed for muscle spasms.    . rosuvastatin (CRESTOR) 10 MG tablet Take 10 mg by mouth 2 (two) times a week. Tuesday and Friday    . oxyCODONE (OXY IR/ROXICODONE) 5 MG immediate release tablet Take 1 tablet (5 mg total) by mouth every 4 (four) hours as needed for moderate pain. (Patient not taking: Reported on 08/02/2020) 15 tablet 0     Physical Exam: Left shoulder demonstrates severely painful and guarded motion as noted at her recent office visit.  She is grossly neurovascular intact.  Plain radiographs confirm severe osteoarthritis with subchondral sclerosis, joint space loss, and peripheral ossified formation.  Vitals  Temp:  [98 F (36.7 C)] 98 F (36.7 C) (12/23 0804) Pulse Rate:  [77] 77 (12/23 0804) Resp:  [16] 16 (12/23 0804) BP: (150)/(68) 150/68 (12/23 0804) SpO2:  [94 %] 94 % (12/23 0804)  Assessment/Plan  Impression: Left shoulder osteoarthritis with rotator cuff dysfunction  Plan of Action: Procedure(s): REVERSE SHOULDER ARTHROPLASTY  [place] Pam M Dierra Riesgo 08/18/2020, 9:32 AM Contact # [phone]

## 2020-08-18 NOTE — Anesthesia Preprocedure Evaluation (Addendum)
Anesthesia Evaluation  Patient identified by MRN, date of birth, ID band Patient awake    Reviewed: Allergy & Precautions, NPO status , Patient's Chart, lab work & pertinent test results  Airway Mallampati: II  TM Distance: >3 FB Neck ROM: Full    Dental no notable dental hx. (+) Caps,    Pulmonary neg pulmonary ROS,    Pulmonary exam normal breath sounds clear to auscultation       Cardiovascular hypertension, Pt. on medications Normal cardiovascular exam Rhythm:Regular Rate:Normal     Neuro/Psych PSYCHIATRIC DISORDERS Anxiety Depression negative neurological ROS     GI/Hepatic Neg liver ROS, hiatal hernia, GERD  Medicated and Controlled,  Endo/Other  negative endocrine ROS  Renal/GU negative Renal ROS  negative genitourinary   Musculoskeletal  (+) Arthritis ,   Abdominal   Peds  Hematology negative hematology ROS (+)   Anesthesia Other Findings   Reproductive/Obstetrics                            Anesthesia Physical Anesthesia Plan  ASA: III  Anesthesia Plan: General and Regional   Post-op Pain Management:  Regional for Post-op pain   Induction: Intravenous  PONV Risk Score and Plan: 3 and Dexamethasone, Ondansetron and Treatment may vary due to age or medical condition  Airway Management Planned: Oral ETT  Additional Equipment:   Intra-op Plan:   Post-operative Plan: Extubation in OR  Informed Consent: I have reviewed the patients History and Physical, chart, labs and discussed the procedure including the risks, benefits and alternatives for the proposed anesthesia with the patient or authorized representative who has indicated his/her understanding and acceptance.     Dental advisory given  Plan Discussed with: CRNA  Anesthesia Plan Comments:         Anesthesia Quick Evaluation

## 2020-08-18 NOTE — Transfer of Care (Signed)
Immediate Anesthesia Transfer of Care Note  Patient: Sandy [name]  Procedure(s) Performed: Procedure(s) with comments: REVERSE SHOULDER ARTHROPLASTY (Left) - 122min  Patient Location: PACU  Anesthesia Type:General  Level of Consciousness:  sedated, patient cooperative and responds to stimulation  Airway & Oxygen Therapy:Patient Spontanous Breathing and Patient connected to face mask oxgen  Post-op Assessment:  Report given to PACU RN and Post -op Vital signs reviewed and stable  Post vital signs:  Reviewed and stable  Last Vitals:  Vitals:   08/18/20 0937 08/18/20 0940  BP:  (!) 181/71  Pulse: 63 69  Resp: 14 18  Temp:    SpO2: 82% 574%    Complications: No apparent anesthesia complications

## 2020-08-18 NOTE — Anesthesia Procedure Notes (Signed)
Anesthesia Regional Block: Interscalene brachial plexus block   Pre-Anesthetic Checklist: ,, timeout performed, Correct Patient, Correct Site, Correct Laterality, Correct Procedure, Correct Position, site marked, Risks and benefits discussed,  Surgical consent,  Pre-op evaluation,  At surgeon's request and post-op pain management  Laterality: Left  Prep: Maximum Sterile Barrier Precautions used, chloraprep       Needles:  Injection technique: Single-shot  Needle Type: Echogenic Stimulator Needle     Needle Length: 9cm  Needle Gauge: 22     Additional Needles:   Procedures:,,,, ultrasound used (permanent image in chart),,,,  Narrative:  Start time: 08/18/2020 9:28 AM End time: 08/18/2020 9:38 AM Injection made incrementally with aspirations every 5 mL.  Performed by: Personally  Anesthesiologist: Freddrick March, MD  Additional Notes: Monitors applied. No increased pain on injection. No increased resistance to injection. Injection made in 5cc increments. Good needle visualization. Patient tolerated procedure well.

## 2020-08-18 NOTE — Progress Notes (Signed)
AssistedDr. Chelsey Woodrum with left, ultrasound guided, interscalene  block. Side rails up, monitors on throughout procedure. See vital signs in flow sheet. Tolerated Procedure well.  

## 2020-08-23 ENCOUNTER — Encounter (HOSPITAL_COMMUNITY): Payer: Self-pay | Admitting: Orthopedic Surgery

## 2020-08-31 DIAGNOSIS — Z96612 Presence of left artificial shoulder joint: Secondary | ICD-10-CM | POA: Diagnosis not present

## 2020-08-31 DIAGNOSIS — Z471 Aftercare following joint replacement surgery: Secondary | ICD-10-CM | POA: Diagnosis not present

## 2020-09-07 DIAGNOSIS — M25512 Pain in left shoulder: Secondary | ICD-10-CM | POA: Diagnosis not present

## 2020-09-20 DIAGNOSIS — M25512 Pain in left shoulder: Secondary | ICD-10-CM | POA: Diagnosis not present

## 2020-09-28 DIAGNOSIS — M25512 Pain in left shoulder: Secondary | ICD-10-CM | POA: Diagnosis not present

## 2020-10-05 DIAGNOSIS — M25512 Pain in left shoulder: Secondary | ICD-10-CM | POA: Diagnosis not present

## 2020-10-12 DIAGNOSIS — M25512 Pain in left shoulder: Secondary | ICD-10-CM | POA: Diagnosis not present

## 2020-10-12 DIAGNOSIS — R7301 Impaired fasting glucose: Secondary | ICD-10-CM | POA: Diagnosis not present

## 2020-10-12 DIAGNOSIS — N1831 Chronic kidney disease, stage 3a: Secondary | ICD-10-CM | POA: Diagnosis not present

## 2020-10-12 DIAGNOSIS — E669 Obesity, unspecified: Secondary | ICD-10-CM | POA: Diagnosis not present

## 2020-10-12 DIAGNOSIS — I10 Essential (primary) hypertension: Secondary | ICD-10-CM | POA: Diagnosis not present

## 2020-10-18 DIAGNOSIS — M25512 Pain in left shoulder: Secondary | ICD-10-CM | POA: Diagnosis not present

## 2020-10-26 DIAGNOSIS — M25512 Pain in left shoulder: Secondary | ICD-10-CM | POA: Diagnosis not present

## 2020-11-02 DIAGNOSIS — M25512 Pain in left shoulder: Secondary | ICD-10-CM | POA: Diagnosis not present

## 2020-11-09 DIAGNOSIS — Z96612 Presence of left artificial shoulder joint: Secondary | ICD-10-CM | POA: Diagnosis not present

## 2020-11-09 DIAGNOSIS — Z471 Aftercare following joint replacement surgery: Secondary | ICD-10-CM | POA: Diagnosis not present

## 2020-11-09 DIAGNOSIS — M19011 Primary osteoarthritis, right shoulder: Secondary | ICD-10-CM | POA: Diagnosis not present

## 2020-11-09 DIAGNOSIS — M25512 Pain in left shoulder: Secondary | ICD-10-CM | POA: Diagnosis not present

## 2021-02-06 DIAGNOSIS — Z96612 Presence of left artificial shoulder joint: Secondary | ICD-10-CM | POA: Diagnosis not present

## 2021-02-06 DIAGNOSIS — M19011 Primary osteoarthritis, right shoulder: Secondary | ICD-10-CM | POA: Diagnosis not present

## 2021-02-21 NOTE — Patient Instructions (Addendum)
DUE TO COVID-19 ONLY ONE VISITOR IS ALLOWED TO COME WITH YOU AND STAY IN THE WAITING ROOM ONLY DURING PRE OP AND PROCEDURE DAY OF SURGERY. THE 1 VISITOR  MAY VISIT WITH YOU AFTER SURGERY IN YOUR PRIVATE ROOM DURING VISITING HOURS ONLY!                 Sandy [name]     Your procedure is scheduled on: 03/03/21   Report to [facility] Main  Entrance   Report to admitting at 10:00 AM     Call this number if you have problems the morning of surgery Felt, NO CHEWING GUM Sandy [name].   No food after midnight.    You may have clear liquid until 9:30 AM.    CLEAR LIQUID DIET   Foods Allowed                                                                     Foods Excluded  Coffee and tea, regular and decaf                             liquids that you cannot  Plain Jell-O any favor except red or purple                                           see through such as: Fruit ices (not with fruit pulp)                                     milk, soups, orange juice  Iced Popsicles                                    All solid food Carbonated beverages, regular and diet                                    Cranberry, grape and apple juices Sports drinks like Gatorade Lightly seasoned clear broth or consume(fat free) Sugar, honey syrup    At 9:00 AM drink pre surgery drink.   Nothing by mouth after 9:30 AM.    Take these medicines the morning of surgery with A SIP OF WATER: Clonazepam if needed, prevacid                                 You may not have any metal on your body including hair pins and              piercings  Do not wear jewelry, make-up, lotions, powders or perfumes, deodorant             Do not wear nail polish on your fingernails.  Do not shave  48 hours prior to  surgery.                 Do not bring valuables to the hospital. Sandy [name].  Contacts, dentures or bridgework may not be worn into surgery.       Patients discharged the day of surgery will not be allowed to drive home. IF YOU ARE HAVING SURGERY AND GOING HOME THE SAME DAY, YOU MUST HAVE AN ADULT TO DRIVE YOU HOME AND BE WITH YOU FOR 24 HOURS. YOU MAY GO HOME BY TAXI OR UBER OR ORTHERWISE, BUT AN ADULT MUST ACCOMPANY YOU HOME AND STAY WITH YOU FOR 24 HOURS.  Name and phone number of your driver:  Special Instructions: N/A              Please read over the following fact sheets you were given: _____________________________________________________________________  [facility]- Preparing for Total Shoulder Arthroplasty    Before surgery, you can play an important role. Because skin is not sterile, your skin needs to be as free of germs as possible. You can reduce the number of germs on your skin by using the following products. Benzoyl Peroxide Gel Reduces the number of germs present on the skin Applied twice a day to shoulder area starting two days before surgery    ==================================================================  Please follow these instructions carefully:  BENZOYL PEROXIDE 5% GEL  Please do not use if you have an allergy to benzoyl peroxide.   If your skin becomes reddened/irritated stop using the benzoyl peroxide.  Starting two days before surgery, apply as follows: Apply benzoyl peroxide in the morning and at night. Apply after taking a shower. If you are not taking a shower clean entire shoulder front, back, and side along with the armpit with a clean wet washcloth.  Place a quarter-sized dollop on your shoulder and rub in thoroughly, making sure to cover the front, back, and side of your shoulder, along with the armpit.   2 days before ____ AM   ____ PM              1 day before ____ AM   ____ PM                         Do this twice a day for two days.  (Last application is the night before surgery, AFTER using the CHG  soap as described below).  Do NOT apply benzoyl peroxide gel on the day of surgery.            Sandy [name] - Preparing for Surgery Before surgery, you can play an important role.  Because skin is not sterile, your skin needs to be as free of germs as possible.  You can reduce the number of germs on your skin by washing with CHG (chlorahexidine gluconate) soap before surgery.  CHG is an antiseptic cleaner which kills germs and bonds with the skin to continue killing germs even after washing. Please DO NOT use if you have an allergy to CHG or antibacterial soaps.  If your skin becomes reddened/irritated stop using the CHG and inform your nurse when you arrive at Short Stay. Do not shave (including legs and underarms) for at least 48 hours prior to the first CHG shower.  Please follow these instructions carefully:  1.  Shower with CHG Soap the night before surgery and the  morning of Surgery.  2.  If you choose to wash your hair, wash your hair first as usual with your  normal  shampoo.  3.  After you shampoo, rinse your hair and body thoroughly to remove the  shampoo.                                        4.  Use CHG as you would any other liquid soap.  You can apply chg directly  to the skin and wash                       Gently with a scrungie or clean washcloth.  5.  Apply the CHG Soap to your body ONLY FROM THE NECK DOWN.   Do not use on face/ open                           Wound or open sores. Avoid contact with eyes, ears mouth and genitals (private parts).                       Wash face,  Genitals (private parts) with your normal soap.             6.  Wash thoroughly, paying special attention to the area where your surgery  will be performed.  7.  Thoroughly rinse your body with warm water from the neck down.  8.  DO NOT shower/wash with your normal soap after using and rinsing off  the CHG Soap.                9.  Pat yourself dry with a clean towel.            10.  Wear clean pajamas.             11.  Place clean sheets on your bed the night of your first shower and do not  sleep with pets. Day of Surgery : Do not apply any lotions/deodorants the morning of surgery.  Please wear clean clothes to the hospital/surgery center.  FAILURE TO FOLLOW THESE INSTRUCTIONS MAY RESULT IN THE CANCELLATION OF YOUR SURGERY PATIENT SIGNATURE_________________________________  NURSE SIGNATURE__________________________________  ________________________________________________________________________   Adam Phenix  An incentive spirometer is a tool that can help keep your lungs clear and active. This tool measures how well you are filling your lungs with each breath. Taking long deep breaths may help reverse or decrease the chance of developing breathing (pulmonary) problems (especially infection) following: A long period of time when you are unable to move or be active. BEFORE THE PROCEDURE  If the spirometer includes an indicator to show your best effort, your nurse or respiratory therapist will set it to a desired goal. If possible, sit up straight or lean slightly forward. Try not to slouch. Hold the incentive spirometer in an upright position. INSTRUCTIONS FOR USE  Sit on the edge of your bed if possible, or sit up as far as you can in bed or on a chair. Hold the incentive spirometer in an upright position. Breathe out normally. Place the mouthpiece in your mouth and seal your lips tightly around it. Breathe in slowly and as deeply as possible, raising the piston or the ball toward the top of the column. Hold your breath for 3-5 seconds or for as  long as possible. Allow the piston or ball to fall to the bottom of the column. Remove the mouthpiece from your mouth and breathe out normally. Rest for a few seconds and repeat Steps 1 through 7 at least 10 times every 1-2 hours when you are awake. Take your time and take a few normal breaths between deep breaths. The spirometer may  include an indicator to show your best effort. Use the indicator as a goal to work toward during each repetition. After each set of 10 deep breaths, practice coughing to be sure your lungs are clear. If you have an incision (the cut made at the time of surgery), support your incision when coughing by placing a pillow or rolled up towels firmly against it. Once you are able to get out of bed, walk around indoors and cough well. You may stop using the incentive spirometer when instructed by your caregiver.  RISKS AND COMPLICATIONS Take your time so you do not get dizzy or light-headed. If you are in pain, you may need to take or ask for pain medication before doing incentive spirometry. It is harder to take a deep breath if you are having pain. AFTER USE Rest and breathe slowly and easily. It can be helpful to keep track of a log of your progress. Your caregiver can provide you with a simple table to help with this. If you are using the spirometer at home, follow these instructions: [place] IF:  You are having difficultly using the spirometer. You have trouble using the spirometer as often as instructed. Your pain medication is not giving enough relief while using the spirometer. You develop fever of 100.5 F (38.1 C) or higher. SEEK IMMEDIATE MEDICAL CARE IF:  You cough up bloody sputum that had not been present before. You develop fever of 102 F (38.9 C) or greater. You develop worsening pain at or near the incision site. MAKE SURE YOU:  Understand these instructions. Will watch your condition. Will get help right away if you are not doing well or get worse. Document Released: 12/24/2006 Document Revised: 11/05/2011 Document Reviewed: 02/24/2007 [facility] Patient Information 2014 [place], [place].   ________________________________________________________________________

## 2021-02-22 ENCOUNTER — Encounter (HOSPITAL_COMMUNITY)
Admission: RE | Admit: 2021-02-22 | Discharge: 2021-02-22 | Disposition: A | Discharge status: Home / Self Care | Payer: Medicare Other | Source: Ambulatory Visit | Attending: Orthopedic Surgery | Admitting: Orthopedic Surgery

## 2021-02-22 ENCOUNTER — Encounter (HOSPITAL_COMMUNITY): Payer: Self-pay

## 2021-02-22 ENCOUNTER — Other Ambulatory Visit: Payer: Self-pay

## 2021-02-22 DIAGNOSIS — Z01812 Encounter for preprocedural laboratory examination: Secondary | ICD-10-CM | POA: Diagnosis not present

## 2021-02-22 HISTORY — DX: Gastro-esophageal reflux disease without esophagitis: K21.9

## 2021-02-22 LAB — CBC
HCT: 41 % (ref 36.0–46.0)
Hemoglobin: 13.5 g/dL (ref 12.0–15.0)
MCH: 31.5 pg (ref 26.0–34.0)
MCHC: 32.9 g/dL (ref 30.0–36.0)
MCV: 95.8 fL (ref 80.0–100.0)
Platelets: 174 10*3/uL (ref 150–400)
RBC: 4.28 MIL/uL (ref 3.87–5.11)
RDW: 12.3 % (ref 11.5–15.5)
WBC: 4.2 10*3/uL (ref 4.0–10.5)
nRBC: 0 % (ref 0.0–0.2)

## 2021-02-22 LAB — BASIC METABOLIC PANEL
Anion gap: 4 — ABNORMAL LOW (ref 5–15)
BUN: 17 mg/dL (ref 8–23)
CO2: 30 mmol/L (ref 22–32)
Calcium: 9.2 mg/dL (ref 8.9–10.3)
Chloride: 105 mmol/L (ref 98–111)
Creatinine, Ser: 0.78 mg/dL (ref 0.44–1.00)
GFR, Estimated: >60 mL/min (ref >60)
Glucose, Bld: 111 mg/dL — ABNORMAL HIGH (ref 70–99)
Potassium: 4.6 mmol/L (ref 3.5–5.1)
Sodium: 139 mmol/L (ref 135–145)

## 2021-02-22 LAB — SURGICAL PCR SCREEN
MRSA, PCR: NEGATIVE
Staphylococcus aureus: NEGATIVE

## 2021-02-22 NOTE — Progress Notes (Signed)
COVID Vaccine Completed:Yes Date COVID Vaccine completed:07/02/21 Booster 12/20/20 COVID vaccine manufacturer: Moderna    PCP - Dr. Baldwin Crown LOV 10/12/20 Cardiologist - none  Chest x-ray - no EKG - 08/09/20-epic Stress Test - no ECHO - no Cardiac Cath - no Pacemaker/ICD device last checked:NA  Sleep Study - no CPAP -   Fasting Blood Sugar - NA Checks Blood Sugar _____ times a day  Blood Thinner Instructions:NA Aspirin Instructions: Last Dose:  Anesthesia review: no  Patient denies shortness of breath, fever, cough and chest pain at PAT appointment Pt lives with her daughter. She reports no SOB with ADLs .  Patient verbalized understanding of instructions that were given to them at the PAT appointment. Patient was also instructed that they will need to review over the PAT instructions again at home before surgery. Yes

## 2021-02-25 DIAGNOSIS — M75101 Unspecified rotator cuff tear or rupture of right shoulder, not specified as traumatic: Secondary | ICD-10-CM | POA: Diagnosis not present

## 2021-02-25 DIAGNOSIS — Z79899 Other long term (current) drug therapy: Secondary | ICD-10-CM | POA: Diagnosis not present

## 2021-02-25 DIAGNOSIS — M19011 Primary osteoarthritis, right shoulder: Secondary | ICD-10-CM | POA: Diagnosis not present

## 2021-03-03 ENCOUNTER — Ambulatory Visit (HOSPITAL_COMMUNITY)
Admission: RE | Admit: 2021-03-03 | Discharge: 2021-03-03 | Disposition: A | Discharge status: Home / Self Care | Payer: Medicare Other | Attending: Orthopedic Surgery | Admitting: Orthopedic Surgery

## 2021-03-03 ENCOUNTER — Ambulatory Visit (HOSPITAL_COMMUNITY): Payer: Medicare Other | Admitting: Anesthesiology

## 2021-03-03 ENCOUNTER — Encounter (HOSPITAL_COMMUNITY)
Admission: RE | Disposition: A | Discharge status: Home / Self Care | Payer: Self-pay | Source: Home / Self Care | Attending: Orthopedic Surgery

## 2021-03-03 ENCOUNTER — Other Ambulatory Visit: Payer: Self-pay

## 2021-03-03 ENCOUNTER — Encounter (HOSPITAL_COMMUNITY): Payer: Self-pay | Admitting: Orthopedic Surgery

## 2021-03-03 DIAGNOSIS — M19011 Primary osteoarthritis, right shoulder: Secondary | ICD-10-CM | POA: Insufficient documentation

## 2021-03-03 DIAGNOSIS — Z79899 Other long term (current) drug therapy: Secondary | ICD-10-CM | POA: Insufficient documentation

## 2021-03-03 DIAGNOSIS — G8918 Other acute postprocedural pain: Secondary | ICD-10-CM | POA: Diagnosis not present

## 2021-03-03 DIAGNOSIS — M12811 Other specific arthropathies, not elsewhere classified, right shoulder: Secondary | ICD-10-CM | POA: Diagnosis not present

## 2021-03-03 DIAGNOSIS — I10 Essential (primary) hypertension: Secondary | ICD-10-CM | POA: Diagnosis not present

## 2021-03-03 DIAGNOSIS — M75101 Unspecified rotator cuff tear or rupture of right shoulder, not specified as traumatic: Secondary | ICD-10-CM | POA: Diagnosis not present

## 2021-03-03 HISTORY — PX: REVERSE SHOULDER ARTHROPLASTY: SHX5054

## 2021-03-03 SURGERY — ARTHROPLASTY, SHOULDER, TOTAL, REVERSE
Anesthesia: General | Site: Shoulder | Laterality: Right

## 2021-03-03 MED ORDER — PHENYLEPHRINE HCL (PRESSORS) 10 MG/ML IV SOLN
INTRAVENOUS | Status: AC
  Filled 2021-03-03: qty 1

## 2021-03-03 MED ORDER — EPHEDRINE SULFATE-NACL 50-0.9 MG/10ML-% IV SOSY
PREFILLED_SYRINGE | INTRAVENOUS | Status: DC | PRN
  Administered 2021-03-03: 5 mg via INTRAVENOUS
  Administered 2021-03-03: 10 mg via INTRAVENOUS
  Administered 2021-03-03: 5 mg via INTRAVENOUS
  Administered 2021-03-03: 10 mg via INTRAVENOUS
  Administered 2021-03-03 (×2): 5 mg via INTRAVENOUS

## 2021-03-03 MED ORDER — FENTANYL CITRATE (PF) 100 MCG/2ML IJ SOLN
50.0000 ug | INTRAMUSCULAR | Status: DC
  Administered 2021-03-03: 50 ug via INTRAVENOUS
  Filled 2021-03-03: qty 2

## 2021-03-03 MED ORDER — METHOCARBAMOL 500 MG PO TABS: 500.0000 mg | ORAL_TABLET | Freq: Three times a day (TID) | ORAL | 1 refills | Status: AC | PRN

## 2021-03-03 MED ORDER — ACETAMINOPHEN 500 MG PO TABS: 1000.0000 mg | ORAL_TABLET | Freq: Once | ORAL | Status: DC

## 2021-03-03 MED ORDER — ONDANSETRON HCL 4 MG/2ML IJ SOLN
INTRAMUSCULAR | Status: AC
  Filled 2021-03-03: qty 2

## 2021-03-03 MED ORDER — PHENYLEPHRINE 40 MCG/ML (10ML) SYRINGE FOR IV PUSH (FOR BLOOD PRESSURE SUPPORT)
PREFILLED_SYRINGE | INTRAVENOUS | Status: DC | PRN
  Administered 2021-03-03: 40 ug via INTRAVENOUS
  Administered 2021-03-03: 80 ug via INTRAVENOUS
  Administered 2021-03-03 (×2): 40 ug via INTRAVENOUS
  Administered 2021-03-03 (×3): 80 ug via INTRAVENOUS
  Administered 2021-03-03: 40 ug via INTRAVENOUS
  Administered 2021-03-03: 80 ug via INTRAVENOUS
  Administered 2021-03-03: 40 ug via INTRAVENOUS
  Administered 2021-03-03: 80 ug via INTRAVENOUS
  Administered 2021-03-03 (×2): 40 ug via INTRAVENOUS
  Administered 2021-03-03: 80 ug via INTRAVENOUS
  Administered 2021-03-03: 40 ug via INTRAVENOUS
  Administered 2021-03-03: 80 ug via INTRAVENOUS

## 2021-03-03 MED ORDER — FENTANYL CITRATE (PF) 100 MCG/2ML IJ SOLN
INTRAMUSCULAR | Status: AC
  Filled 2021-03-03: qty 2

## 2021-03-03 MED ORDER — PHENYLEPHRINE 40 MCG/ML (10ML) SYRINGE FOR IV PUSH (FOR BLOOD PRESSURE SUPPORT)
PREFILLED_SYRINGE | INTRAVENOUS | Status: AC
  Filled 2021-03-03: qty 30

## 2021-03-03 MED ORDER — BUPIVACAINE-EPINEPHRINE (PF) 0.5% -1:200000 IJ SOLN
INTRAMUSCULAR | Status: DC | PRN
  Administered 2021-03-03: 15 mL via PERINEURAL

## 2021-03-03 MED ORDER — VANCOMYCIN HCL 1000 MG IV SOLR
INTRAVENOUS | Status: AC
  Filled 2021-03-03: qty 1000

## 2021-03-03 MED ORDER — PROPOFOL 10 MG/ML IV BOLUS
INTRAVENOUS | Status: DC | PRN
  Administered 2021-03-03: 90 mg via INTRAVENOUS
  Administered 2021-03-03: 30 mg via INTRAVENOUS

## 2021-03-03 MED ORDER — FENTANYL CITRATE (PF) 100 MCG/2ML IJ SOLN: 25.0000 ug | INTRAMUSCULAR | Status: DC | PRN

## 2021-03-03 MED ORDER — CEFAZOLIN SODIUM-DEXTROSE 2-4 GM/100ML-% IV SOLN
2.0000 g | INTRAVENOUS | Status: AC
  Administered 2021-03-03: 2 g via INTRAVENOUS
  Filled 2021-03-03: qty 100

## 2021-03-03 MED ORDER — ORAL CARE MOUTH RINSE: 15.0000 mL | Freq: Once | OROMUCOSAL | Status: AC

## 2021-03-03 MED ORDER — LACTATED RINGERS IV BOLUS
500.0000 mL | Freq: Once | INTRAVENOUS | Status: AC
  Administered 2021-03-03: 500 mL via INTRAVENOUS

## 2021-03-03 MED ORDER — ROCURONIUM BROMIDE 100 MG/10ML IV SOLN
INTRAVENOUS | Status: DC | PRN
  Administered 2021-03-03: 50 mg via INTRAVENOUS

## 2021-03-03 MED ORDER — CHLORHEXIDINE GLUCONATE 0.12 % MT SOLN
15.0000 mL | Freq: Once | OROMUCOSAL | Status: AC
  Administered 2021-03-03: 15 mL via OROMUCOSAL

## 2021-03-03 MED ORDER — TRANEXAMIC ACID 1000 MG/10ML IV SOLN: 1000.0000 mg | INTRAVENOUS | Status: DC

## 2021-03-03 MED ORDER — SODIUM CHLORIDE 0.9 % IR SOLN
Status: DC | PRN
  Administered 2021-03-03: 1000 mL

## 2021-03-03 MED ORDER — OXYCODONE HCL 5 MG PO TABS: 5.0000 mg | ORAL_TABLET | Freq: Once | ORAL | Status: DC | PRN

## 2021-03-03 MED ORDER — LACTATED RINGERS IV SOLN: INTRAVENOUS | Status: DC

## 2021-03-03 MED ORDER — SUGAMMADEX SODIUM 200 MG/2ML IV SOLN
INTRAVENOUS | Status: DC | PRN
  Administered 2021-03-03: 125 mg via INTRAVENOUS

## 2021-03-03 MED ORDER — OXYCODONE HCL 5 MG PO TABS: 5.0000 mg | ORAL_TABLET | Freq: Three times a day (TID) | ORAL | 0 refills | Status: AC | PRN

## 2021-03-03 MED ORDER — PROPOFOL 10 MG/ML IV BOLUS
INTRAVENOUS | Status: AC
  Filled 2021-03-03: qty 20

## 2021-03-03 MED ORDER — VANCOMYCIN HCL 1000 MG IV SOLR
INTRAVENOUS | Status: DC | PRN
  Administered 2021-03-03: 1000 mg

## 2021-03-03 MED ORDER — TRAMADOL HCL 50 MG PO TABS: 50.0000 mg | ORAL_TABLET | Freq: Four times a day (QID) | ORAL | 0 refills | Status: AC | PRN

## 2021-03-03 MED ORDER — FENTANYL CITRATE (PF) 100 MCG/2ML IJ SOLN
INTRAMUSCULAR | Status: DC | PRN
  Administered 2021-03-03: 50 ug via INTRAVENOUS

## 2021-03-03 MED ORDER — LACTATED RINGERS IV BOLUS
250.0000 mL | Freq: Once | INTRAVENOUS | Status: AC
  Administered 2021-03-03: 250 mL via INTRAVENOUS

## 2021-03-03 MED ORDER — DEXAMETHASONE SODIUM PHOSPHATE 10 MG/ML IJ SOLN
INTRAMUSCULAR | Status: DC | PRN
  Administered 2021-03-03: 8 mg via INTRAVENOUS

## 2021-03-03 MED ORDER — BUPIVACAINE LIPOSOME 1.3 % IJ SUSP
INTRAMUSCULAR | Status: DC | PRN
  Administered 2021-03-03: 10 mL via PERINEURAL

## 2021-03-03 MED ORDER — DEXAMETHASONE SODIUM PHOSPHATE 10 MG/ML IJ SOLN
INTRAMUSCULAR | Status: AC
  Filled 2021-03-03: qty 1

## 2021-03-03 MED ORDER — PROMETHAZINE HCL 25 MG/ML IJ SOLN: 6.2500 mg | INTRAMUSCULAR | Status: DC | PRN

## 2021-03-03 MED ORDER — ONDANSETRON HCL 4 MG/2ML IJ SOLN
INTRAMUSCULAR | Status: DC | PRN
  Administered 2021-03-03: 4 mg via INTRAVENOUS

## 2021-03-03 MED ORDER — LIDOCAINE 2% (20 MG/ML) 5 ML SYRINGE
INTRAMUSCULAR | Status: AC
  Filled 2021-03-03: qty 5

## 2021-03-03 MED ORDER — ROCURONIUM BROMIDE 10 MG/ML (PF) SYRINGE
PREFILLED_SYRINGE | INTRAVENOUS | Status: AC
  Filled 2021-03-03: qty 10

## 2021-03-03 MED ORDER — OXYCODONE HCL 5 MG/5ML PO SOLN: 5.0000 mg | Freq: Once | ORAL | Status: DC | PRN

## 2021-03-03 MED ORDER — TRANEXAMIC ACID-NACL 1000-0.7 MG/100ML-% IV SOLN
1000.0000 mg | INTRAVENOUS | Status: AC
  Administered 2021-03-03: 1000 mg via INTRAVENOUS
  Filled 2021-03-03: qty 100

## 2021-03-03 MED ORDER — LIDOCAINE 2% (20 MG/ML) 5 ML SYRINGE
INTRAMUSCULAR | Status: DC | PRN
  Administered 2021-03-03: 60 mg via INTRAVENOUS

## 2021-03-03 SURGICAL SUPPLY — 71 items
ADH SKN CLS APL DERMABOND .7 (GAUZE/BANDAGES/DRESSINGS) ×1
AID PSTN UNV HD RSTRNT DISP (MISCELLANEOUS) ×1
BAG COUNTER SPONGE SURGICOUNT (BAG) ×1 IMPLANT
BAG SPEC THK2 15X12 ZIP CLS (MISCELLANEOUS) ×1
BAG SPNG CNTER NS LX DISP (BAG) ×1
BAG ZIPLOCK 12X15 (MISCELLANEOUS) ×2 IMPLANT
BLADE SAW SGTL 83.5X18.5 (BLADE) ×2 IMPLANT
BSPLAT GLND +2X24 MDLR (Joint) ×1 IMPLANT
COOLER ICEMAN CLASSIC (MISCELLANEOUS) ×1 IMPLANT
COVER BACK TABLE 60X90IN (DRAPES) ×2 IMPLANT
COVER SURGICAL LIGHT HANDLE (MISCELLANEOUS) ×2 IMPLANT
CUP SUT UNIV REVERS 36 NEUTRAL (Cup) ×1 IMPLANT
DERMABOND ADVANCED (GAUZE/BANDAGES/DRESSINGS) ×1
DERMABOND ADVANCED .7 DNX12 (GAUZE/BANDAGES/DRESSINGS) ×1 IMPLANT
DRAPE INCISE IOBAN 66X45 STRL (DRAPES) IMPLANT
DRAPE ORTHO SPLIT 77X108 STRL (DRAPES) ×4
DRAPE SHEET LG 3/4 BI-LAMINATE (DRAPES) ×2 IMPLANT
DRAPE SURG 17X11 SM STRL (DRAPES) ×2 IMPLANT
DRAPE SURG ORHT 6 SPLT 77X108 (DRAPES) ×2 IMPLANT
DRAPE TOP 10253 STERILE (DRAPES) ×2 IMPLANT
DRAPE U-SHAPE 47X51 STRL (DRAPES) ×2 IMPLANT
DRESSING AQUACEL AG SP 3.5X6 (GAUZE/BANDAGES/DRESSINGS) ×1 IMPLANT
DRSG AQUACEL AG ADV 3.5X 6 (GAUZE/BANDAGES/DRESSINGS) ×1 IMPLANT
DRSG AQUACEL AG ADV 3.5X10 (GAUZE/BANDAGES/DRESSINGS) IMPLANT
DRSG AQUACEL AG SP 3.5X6 (GAUZE/BANDAGES/DRESSINGS) ×2
DURAPREP 26ML APPLICATOR (WOUND CARE) ×2 IMPLANT
ELECT BLADE TIP CTD 4 INCH (ELECTRODE) ×2 IMPLANT
ELECT REM PT RETURN 15FT ADLT (MISCELLANEOUS) ×2 IMPLANT
FACESHIELD WRAPAROUND (MASK) ×8 IMPLANT
FACESHIELD WRAPAROUND OR TEAM (MASK) ×4 IMPLANT
GLENOID UNI REV MOD 24 +2 LAT (Joint) ×2 IMPLANT
GLENOSPHERE 36 +4 LAT/24 (Joint) ×1 IMPLANT
GLOVE SRG 8 PF TXTR STRL LF DI (GLOVE) ×1 IMPLANT
GLOVE SURG ENC MOIS LTX SZ7 (GLOVE) ×2 IMPLANT
GLOVE SURG ENC MOIS LTX SZ7.5 (GLOVE) ×2 IMPLANT
GLOVE SURG UNDER POLY LF SZ7 (GLOVE) ×2 IMPLANT
GLOVE SURG UNDER POLY LF SZ8 (GLOVE) ×2
GOWN STRL REUS W/TWL LRG LVL3 (GOWN DISPOSABLE) ×4 IMPLANT
INSERT HUMERAL 36 +6 (Shoulder) ×1 IMPLANT
KIT BASIN OR (CUSTOM PROCEDURE TRAY) ×2 IMPLANT
KIT TURNOVER KIT A (KITS) ×2 IMPLANT
MANIFOLD NEPTUNE II (INSTRUMENTS) ×2 IMPLANT
NEEDLE TAPERED W/ NITINOL LOOP (MISCELLANEOUS) ×2 IMPLANT
NS IRRIG 1000ML POUR BTL (IV SOLUTION) ×2 IMPLANT
PACK SHOULDER (CUSTOM PROCEDURE TRAY) ×2 IMPLANT
PAD ARMBOARD 7.5X6 YLW CONV (MISCELLANEOUS) ×2 IMPLANT
PAD COLD SHLDR WRAP-ON (PAD) ×1 IMPLANT
PIN NITINOL TARGETER 2.8 (PIN) IMPLANT
PIN SET MODULAR GLENOID SYSTEM (PIN) ×1 IMPLANT
RESTRAINT HEAD UNIVERSAL NS (MISCELLANEOUS) ×2 IMPLANT
SCREW CENTRAL MODULAR 25 (Screw) ×1 IMPLANT
SCREW PERI LOCK 5.5X16 (Screw) ×2 IMPLANT
SCREW PERI LOCK 5.5X24 (Screw) ×2 IMPLANT
SLING ARM FOAM STRAP LRG (SOFTGOODS) IMPLANT
SLING ARM FOAM STRAP MED (SOFTGOODS) ×1 IMPLANT
SPONGE T-LAP 18X18 ~~LOC~~+RFID (SPONGE) ×1 IMPLANT
SPONGE T-LAP 4X18 ~~LOC~~+RFID (SPONGE) ×2 IMPLANT
STEM HUMERAL UNIVERS SZ8 (Stem) ×2 IMPLANT
SUCTION FRAZIER HANDLE 12FR (TUBING) ×2
SUCTION TUBE FRAZIER 12FR DISP (TUBING) ×1 IMPLANT
SUT FIBERWIRE #2 38 T-5 BLUE (SUTURE)
SUT MNCRL AB 3-0 PS2 18 (SUTURE) ×2 IMPLANT
SUT MON AB 2-0 CT1 36 (SUTURE) ×2 IMPLANT
SUT VIC AB 1 CT1 36 (SUTURE) ×2 IMPLANT
SUTURE FIBERWR #2 38 T-5 BLUE (SUTURE) IMPLANT
SUTURE TAPE 1.3 40 TPR END (SUTURE) ×2 IMPLANT
SUTURETAPE 1.3 40 TPR END (SUTURE) ×4
TOWEL OR 17X26 10 PK STRL BLUE (TOWEL DISPOSABLE) ×2 IMPLANT
TOWEL OR NON WOVEN STRL DISP B (DISPOSABLE) ×2 IMPLANT
WATER STERILE IRR 1000ML POUR (IV SOLUTION) ×4 IMPLANT
YANKAUER SUCT BULB TIP 10FT TU (MISCELLANEOUS) ×2 IMPLANT

## 2021-03-03 NOTE — Transfer of Care (Signed)
Immediate Anesthesia Transfer of Care Note  Patient: Sandy [name]  Procedure(s) Performed: REVERSE SHOULDER ARTHROPLASTY (Right: Shoulder)  Patient Location: PACU  Anesthesia Type:General and Regional  Level of Consciousness: awake and patient cooperative  Airway & Oxygen Therapy: Patient Spontanous Breathing  Post-op Assessment: Report given to RN and Post -op Vital signs reviewed and stable  Post vital signs: Reviewed and stable  Last Vitals:  Vitals Value Taken Time  BP 161/92 03/03/21 1401  Temp 36.2 C 03/03/21 1401  Pulse 90 03/03/21 1407  Resp 17 03/03/21 1407  SpO2 97 % 03/03/21 1407  Vitals shown include unvalidated device data.  Last Pain:  Vitals:   03/03/21 1401  TempSrc:   PainSc: Asleep         Complications: No notable events documented.

## 2021-03-03 NOTE — Discharge Instructions (Signed)
 Kevin M. Supple, M.D., F.A.A.O.S. Orthopaedic Surgery Specializing in Arthroscopic and Reconstructive Surgery of the Shoulder [phone] [address] - [place], [place] [address] - Fax [phone]   POST-OP TOTAL SHOULDER REPLACEMENT INSTRUCTIONS  1. Follow up in the office for your first post-op appointment 10-14 days from the date of your surgery. If you do not already have a scheduled appointment, our office will contact you to schedule.  2. The bandage over your incision is waterproof. You may begin showering with this dressing on. You may leave this dressing on until first follow up appointment within 2 weeks. We prefer you leave this dressing in place until follow up however after 5-7 days if you are having itching or skin irritation and would like to remove it you may do so. Go slow and tug at the borders gently to break the bond the dressing has with the skin. At this point if there is no drainage it is okay to go without a bandage or you may cover it with a light guaze and tape. You can also expect significant bruising around your shoulder that will drift down your arm and into your chest wall. This is very normal and should resolve over several days.   3. Wear your sling/immobilizer at all times except to perform the exercises below or to occasionally let your arm dangle by your side to stretch your elbow. You also need to sleep in your sling immobilizer until instructed otherwise. It is ok to remove your sling if you are sitting in a controlled environment and allow your arm to rest in a position of comfort by your side or on your lap with pillows to give your neck and skin a break from the sling. You may remove it to allow arm to dangle by side to shower. If you are up walking around and when you go to sleep at night you need to wear it.  4. Range of motion to your elbow, wrist, and hand are encouraged 3-5 times daily. Exercise to your hand and fingers helps to reduce  swelling you may experience.   5. Prescriptions for a pain medication and a muscle relaxant are provided for you. It is recommended that if you are experiencing pain that you pain medication alone is not controlling, add the muscle relaxant along with the pain medication which can give additional pain relief. The first 1-2 days is generally the most severe of your pain and then should gradually decrease. As your pain lessens it is recommended that you decrease your use of the pain medications to an "as needed basis'" only and to always comply with the recommended dosages of the pain medications.  6. Pain medications can produce constipation along with their use. If you experience this, the use of an over the counter stool softener or laxative daily is recommended.   7. For additional questions or concerns, please do not hesitate to call the office. If after hours there is an answering service to forward your concerns to the physician on call.  8.Pain control following an exparel block  To help control your post-operative pain you received a nerve block  performed with Exparel which is a long acting anesthetic (numbing agent) which can provide pain relief and sensations of numbness (and relief of pain) in the operative shoulder and arm for up to 3 days. Sometimes it provides mixed relief, meaning you may still have numbness in certain areas of the arm but can still be able to   move  parts of that arm, hand, and fingers. We recommend that your prescribed pain medications  be used as needed. We do not feel it is necessary to "pre medicate" and "stay ahead" of pain.  Taking narcotic pain medications when you are not having any pain can lead to unnecessary and potentially dangerous side effects.    9. Use the ice machine as much as possible in the first 5-7 days from surgery, then you can wean its use to as needed. The ice typically needs to be replaced every 6 hours, instead of ice you can actually freeze  water bottles to put in the cooler and then fill water around them to avoid having to purchase ice. You can have spare water bottles freezing to allow you to rotate them once they have melted. Try to have a thin shirt or light cloth or towel under the ice wrap to protect your skin.   FOR ADDITIONAL INFO ON ICE MACHINE AND INSTRUCTIONS GO TO THE WEBSITE AT  https://www.djogl[employer].com/products/donjoy/donjoy-iceman-[email or web address]  10.  We recommend that you avoid any dental work or cleaning in the first 3 months following your joint replacement. This is to help minimize the possibility of infection from the bacteria in your mouth that enters your bloodstream during dental work. We also recommend that you take an antibiotic prior to your dental work for the first year after your shoulder replacement to further help reduce that risk. Please simply contact our office for antibiotics to be sent to your pharmacy prior to dental work.  11. Dental Antibiotics:  In most cases prophylactic antibiotics for Dental procdeures after total joint surgery are not necessary.  Exceptions are as follows:  1. History of prior total joint infection  2. Severely immunocompromised (Organ Transplant, cancer chemotherapy, Rheumatoid biologic meds such as Humera)  3. Poorly controlled diabetes (A1C &gt; 8.0, blood glucose over 200)  If you have one of these conditions, contact your surgeon for an antibiotic prescription, prior to your dental procedure.   POST-OP EXERCISES  Pendulum Exercises  Perform pendulum exercises while standing and bending at the waist. Support your uninvolved arm on a table or chair and allow your operated arm to hang freely. Make sure to do these exercises passively - not using you shoulder muscles. These exercises can be performed once your nerve block effects have worn off.  Repeat 20 times. Do 3 sessions per day.     

## 2021-03-03 NOTE — Anesthesia Procedure Notes (Signed)
Procedure Name: Intubation Date/Time: 03/03/2021 12:23 PM Performed by: Gwyndolyn Saxon, CRNA Pre-anesthesia Checklist: Patient identified, Emergency Drugs available, Suction available and Patient being monitored Patient Re-evaluated:Patient Re-evaluated prior to induction Oxygen Delivery Method: Circle system utilized Preoxygenation: Pre-oxygenation with 100% oxygen Induction Type: IV induction Ventilation: Mask ventilation without difficulty Laryngoscope Size: Miller and 2 Grade View: Grade I Tube type: Oral Tube size: 6.5 mm Number of attempts: 1 Airway Equipment and Method: Patient positioned with wedge pillow and Stylet Placement Confirmation: ETT inserted through vocal cords under direct vision, positive ETCO2 and breath sounds checked- equal and bilateral Secured at: 21 cm Tube secured with: Tape Dental Injury: Teeth and Oropharynx as per pre-operative assessment

## 2021-03-03 NOTE — H&P (Signed)
Sandy [name]    Chief Complaint: Right shoulder rotator cuff tear arthropathy HPI: The patient is a 87 y.o. female with chronic and progressively increasing right shoulder pain related to severe rotator cuff tear arthropathy.  Due to her increasing functional limitations and failure to respond to prolonged attempts at conservative management, she is brought to the operating this time for planned right shoulder reverse arthroplasty.  Past Medical History:  Diagnosis Date   Anxiety    Arthritis    Barrett's esophagus    Colon polyps    Depression    Diverticulitis    Diverticulosis    GERD (gastroesophageal reflux disease)    Hepatic steatosis    Hiatal hernia    Hyperlipidemia    Hypertension    Ovarian cyst    Pulmonary hypertension ([place])     Past Surgical History:  Procedure Laterality Date   BREAST SURGERY     for benign tumor   CARPAL TUNNEL RELEASE Right 1990   CHOLECYSTECTOMY N/A 07/24/2018   Procedure: LAPAROSCOPIC CHOLECYSTECTOMY;  Surgeon: Judeth Horn, MD;  Location: [facility];  Service: General;  Laterality: N/A;   REVERSE SHOULDER ARTHROPLASTY Left 08/18/2020   Procedure: REVERSE SHOULDER ARTHROPLASTY;  Surgeon: Justice Britain, MD;  Location: [facility];  Service: Orthopedics;  Laterality: Left;  126min    Family History  Problem Relation Age of Onset   COPD Mother    Liver cancer Mother    Heart disease Maternal Aunt    Heart disease Paternal Grandfather    CAD Father     Social History:  reports that she has never smoked. She has never used smokeless tobacco. She reports that she does not drink alcohol and does not use drugs.   Medications Prior to Admission  Medication Sig Dispense Refill   acetaminophen (TYLENOL) 325 MG tablet Take 2 tablets (650 mg total) by mouth every 6 (six) hours as needed. (Patient taking differently: Take 650 mg by mouth every 6 (six) hours as needed for moderate pain.)     Biotin w/ Vitamins C & E (HAIR SKIN & NAILS GUMMIES PO)  Take 2 capsules by mouth daily.     clonazePAM (KLONOPIN) 0.5 MG tablet Take 0.5 mg by mouth 3 (three) times daily as needed for anxiety.      COLLAGEN PO Take 1 Scoop by mouth daily.     dextromethorphan-guaiFENesin (MUCINEX DM) 30-600 MG 12hr tablet Take 1 tablet by mouth 2 (two) times daily. As needed     ergocalciferol (VITAMIN D2) 50000 units capsule Take 50,000 Units by mouth every Friday.      ibuprofen (ADVIL,MOTRIN) 200 MG tablet Take 400 mg by mouth every 6 (six) hours as needed (pain).     lansoprazole (PREVACID) 30 MG capsule Take 30 mg by mouth daily.     losartan (COZAAR) 100 MG tablet Take 100 mg by mouth daily.      meloxicam (MOBIC) 7.5 MG tablet Take 7.5 mg by mouth daily as needed for pain.     rosuvastatin (CRESTOR) 10 MG tablet Take 10 mg by mouth 2 (two) times a week. Monday and Friday       Physical Exam: Right shoulder demonstrates painful and guarded motion as noted at her recent office visits.  Global weakness to manual muscle testing.  Skin is intact.  Grossly neurovascular intact distally.  Radiographs  Plain films confirm severe osteoarthritis as well as narrowing of the acromiohumeral interval consistent with rotator cuff tear arthropathy.  Vitals  Temp:  [  97.9 F (36.6 C)] 97.9 F (36.6 C) (07/08 1024) Pulse Rate:  [88] 88 (07/08 1024) Resp:  [15] 15 (07/08 1024) BP: (154)/(79) 154/79 (07/08 1024) SpO2:  [99 %] 99 % (07/08 1024) Weight:  [65.3 kg] 65.3 kg (07/08 1032)  Assessment/Plan  Impression: Right shoulder rotator cuff tear arthropathy  Plan of Action: Procedure(s): REVERSE SHOULDER ARTHROPLASTY  Fronnie Urton M Terrina Docter 03/03/2021, 11:17 AM Contact # [phone]

## 2021-03-03 NOTE — Evaluation (Signed)
Occupational Therapy Evaluation Patient Details Name: LEXINE JASPERS MRN: [id number] DOB: [birth year] Today's Date: 03/03/2021    History of Present Illness s/p right rTSA   Clinical Impression   Sandy [name] is an 87 year old woman s/p shoulder replacement without functional use of right dominant upper extremity secondary to effects of surgery and interscalene block and shoulder precautions. Therapist provided education and instruction to patient and daughter in regards to exercises, precautions, positioning, donning upper extremity clothing and bathing while maintaining shoulder precautions, and donning/doffing sling. Patient's alertness limited by medications so daughter verbalized understanding and demonstrated as needed. Patient and daughter familiar with all instructions due to prior shoulder surgery. Daughter will be present to assist with all tasks. Handouts provided to maximize retention of instructions and education. Education limited today due to patient have excessive phlegm and gagging and needing to be managed. Daughter states she will read all the handouts. Patient to follow up with MD for further therapy needs.      Follow Up Recommendations  Follow surgeon's recommendation for DC plan and follow-up therapies    Equipment Recommendations  None recommended by OT    Recommendations for Other Services       Precautions / Restrictions Precautions Precautions: Shoulder Type of Shoulder Precautions: If sitting in controlled environment, ok to come out of sling to give neck a break. Please sleep in it to protect until follow up in office.     OK to use operative arm for feeding, hygiene and ADLs.   Ok to instruct Pendulums and lap slides as exercises. Ok to use operative arm within the following parameters for ADL purposes     New ROM (8/18)   Ok for PROM, AAROM, AROM within pain tolerance and within the following ROM   ER 20   ABD 45   FE 60 Shoulder Interventions:  Shoulder sling/immobilizer;At all times;Off for dressing/bathing/exercises Precaution Booklet Issued: Yes (comment) (handouts) Required Braces or Orthoses: Sling Restrictions Weight Bearing Restrictions: Yes RUE Weight Bearing: Non weight bearing      Mobility Bed Mobility                    Transfers Overall transfer level: Modified independent                    Balance Overall balance assessment: No apparent balance deficits (not formally assessed)                                         ADL either performed or assessed with clinical judgement   ADL                                         General ADL Comments: Needed assistance to don upper and lower body clothing. min guard for standing and transfers.     Vision Patient Visual Report: No change from baseline       Perception     Praxis      Pertinent Vitals/Pain Pain Assessment: No/denies pain     Hand Dominance     Extremity/Trunk Assessment Upper Extremity Assessment Upper Extremity Assessment: RUE deficits/detail RUE Deficits / Details: impaired by interscalene block RUE Sensation: decreased light touch   Lower Extremity Assessment Lower Extremity Assessment: Overall WFL for tasks  assessed   Cervical / Trunk Assessment Cervical / Trunk Assessment: Normal   Communication     Cognition Arousal/Alertness: Suspect due to medications Behavior During Therapy: WFL for tasks assessed/performed Overall Cognitive Status: Within Functional Limits for tasks assessed                                     General Comments       Exercises     Shoulder Instructions Shoulder Instructions Donning/doffing shirt without moving shoulder: Caregiver independent with task Method for sponge bathing under operated UE: Caregiver independent with task Donning/doffing sling/immobilizer: Caregiver independent with task Correct positioning of  sling/immobilizer: Caregiver independent with task Pendulum exercises (written home exercise program): Caregiver independent with task ROM for elbow, wrist and digits of operated UE: Caregiver independent with task Sling wearing schedule (on at all times/off for ADL's): Caregiver independent with task Proper positioning of operated UE when showering: Caregiver independent with task Dressing change: Caregiver independent with task Positioning of UE while sleeping: Caregiver independent with task    Home Living Family/patient expects to be discharged to:: Private residence Living Arrangements: Children Available Help at Discharge: Available 24 hours/day                                    Prior Functioning/Environment                   OT Problem List: Decreased range of motion;Decreased strength;Impaired UE functional use;Pain      OT Treatment/Interventions:      OT Goals(Current goals can be found in the care plan section) Acute Rehab OT Goals OT Goal Formulation: All assessment and education complete, DC therapy  OT Frequency:     Barriers to D/C:            Co-evaluation              AM-PAC OT "6 Clicks" Daily Activity     Outcome Measure Help from another person eating meals?: A Little Help from another person taking care of personal grooming?: A Little Help from another person toileting, which includes using toliet, bedpan, or urinal?: A Little Help from another person bathing (including washing, rinsing, drying)?: A Little Help from another person to put on and taking off regular upper body clothing?: A Lot Help from another person to put on and taking off regular lower body clothing?: A Lot 6 Click Score: 16   End of Session Nurse Communication:  (OT education complete)  Activity Tolerance: Patient tolerated treatment well Patient left: in chair;with call bell/phone within reach;with family/visitor present  OT Visit Diagnosis: Pain                 Time: 1535-1605 OT Time Calculation (min): 30 min Charges:  OT General Charges $OT Visit: 1 Visit OT Evaluation $OT Eval Low Complexity: 1 Low OT Treatments $Self Care/Home Management : 8-22 mins  Kal Chait, OTR/L [place]  Office [phone] Pager: [phone]   Lenward Chancellor 03/03/2021, 4:09 PM

## 2021-03-03 NOTE — Anesthesia Preprocedure Evaluation (Addendum)
Anesthesia Evaluation  Patient identified by MRN, date of birth, ID band Patient awake    Reviewed: Allergy & Precautions, NPO status , Patient's Chart, lab work & pertinent test results  History of Anesthesia Complications Negative for: history of anesthetic complications  Airway Mallampati: II  TM Distance: >3 FB Neck ROM: Full    Dental no notable dental hx.    Pulmonary neg pulmonary ROS,    Pulmonary exam normal        Cardiovascular hypertension, Pt. on medications Normal cardiovascular exam  History of mild pulmonary HTN on RHC in 2012   Neuro/Psych Anxiety Depression negative neurological ROS     GI/Hepatic Neg liver ROS, hiatal hernia, GERD  Medicated and Controlled,  Endo/Other  negative endocrine ROS  Renal/GU negative Renal ROS  negative genitourinary   Musculoskeletal  (+) Arthritis , Osteoarthritis,    Abdominal   Peds  Hematology negative hematology ROS (+)   Anesthesia Other Findings Day of surgery medications reviewed with patient.  Reproductive/Obstetrics negative OB ROS                            Anesthesia Physical Anesthesia Plan  ASA: 3  Anesthesia Plan: General   Post-op Pain Management: GA combined w/ Regional for post-op pain   Induction: Intravenous  PONV Risk Score and Plan: 3 and Treatment may vary due to age or medical condition, Ondansetron, Dexamethasone and Midazolam  Airway Management Planned: Oral ETT  Additional Equipment: None  Intra-op Plan:   Post-operative Plan: Extubation in OR  Informed Consent: I have reviewed the patients History and Physical, chart, labs and discussed the procedure including the risks, benefits and alternatives for the proposed anesthesia with the patient or authorized representative who has indicated his/her understanding and acceptance.     Dental advisory given  Plan Discussed with: CRNA  Anesthesia Plan  Comments:        Anesthesia Quick Evaluation

## 2021-03-03 NOTE — Progress Notes (Signed)
Assisted Dr. Howse with right, ultrasound guided, interscalene  block. Side rails up, monitors on throughout procedure. See vital signs in flow sheet. Tolerated Procedure well.  

## 2021-03-03 NOTE — Anesthesia Postprocedure Evaluation (Signed)
Anesthesia Post Note  Patient: Sandy [name]  Procedure(s) Performed: REVERSE SHOULDER ARTHROPLASTY (Right: Shoulder)     Patient location during evaluation: PACU Anesthesia Type: General Level of consciousness: awake and alert and oriented Pain management: pain level controlled Vital Signs Assessment: post-procedure vital signs reviewed and stable Respiratory status: spontaneous breathing, nonlabored ventilation and respiratory function stable Cardiovascular status: blood pressure returned to baseline Postop Assessment: no apparent nausea or vomiting Anesthetic complications: no   No notable events documented.  Last Vitals:  Vitals:   03/03/21 1430 03/03/21 1436  BP: (!) 154/88   Pulse: 89 91  Resp: (!) 22 11  Temp:  (!) 36.3 C  SpO2: 97% 93%    Last Pain:  Vitals:   03/03/21 1436  TempSrc:   PainSc: 0-No pain                 Brennan Bailey

## 2021-03-03 NOTE — Anesthesia Procedure Notes (Signed)
  Anesthesia Regional Block: Interscalene brachial plexus block   Pre-Anesthetic Checklist: , timeout performed,  Correct Patient, Correct Site, Correct Laterality,  Correct Procedure, Correct Position, site marked,  Risks and benefits discussed,  Pre-op evaluation,  At surgeon's request and post-op pain management  Laterality: Right  Prep: Maximum Sterile Barrier Precautions used, chloraprep       Needles:  Injection technique: Single-shot  Needle Type: Echogenic Stimulator Needle     Needle Length: 9cm  Needle Gauge: 22     Additional Needles:   Procedures:,,,, ultrasound used (permanent image in chart),,    Narrative:  Start time: 03/03/2021 11:38 AM End time: 03/03/2021 11:41 AM Injection made incrementally with aspirations every 5 mL.  Performed by: Personally  Anesthesiologist: Brennan Bailey, MD  Additional Notes: Risks, benefits, and alternative discussed. Patient gave consent for procedure. Patient prepped and draped in sterile fashion. Sedation administered, patient remains easily responsive to voice. Relevant anatomy identified with ultrasound guidance. Local anesthetic given in 5cc increments with no signs or symptoms of intravascular injection. No pain or paraesthesias with injection. Patient monitored throughout procedure with signs of LAST or immediate complications. Tolerated well. Ultrasound image placed in chart.  Tawny Asal, MD

## 2021-03-03 NOTE — Op Note (Signed)
03/03/2021  1:38 PM  PATIENT:   Sandy [name]  87 y.o. female  PRE-OPERATIVE DIAGNOSIS:  Right shoulder rotator cuff tear arthropathy  POST-OPERATIVE DIAGNOSIS: Same  PROCEDURE: Right shoulder reverse arthroplasty utilizing a press-fit size 8 Arthrex stem with a neutral metaphysis, +6 polyethylene insert, 36/+4 glenosphere on a small/+2 baseplate  SURGEON:  Sarin Comunale, Metta Clines M.D.  ASSISTANTS: Jenetta Loges, PA-C  ANESTHESIA:   General endotracheal and interscalene block with Exparel  EBL: 100 cc  SPECIMEN: None  Drains: None   PATIENT DISPOSITION:  PACU - hemodynamically stable.    PLAN OF CARE: Discharge to home after PACU  Brief history:  Patient is an 87 year old female who has had chronic and progressively increasing right shoulder pain related to severe rotator cuff tear arthropathy.  She is well-known to our practice after a previous left shoulder reverse arthroplasty which she has done very well with.  She is now brought to the operating room for planned right shoulder reverse arthroplasty.  Preoperatively, I counseled the patient regarding treatment options and risks versus benefits thereof.  Possible surgical complications were all reviewed including potential for bleeding, infection, neurovascular injury, persistent pain, loss of motion, anesthetic complication, failure of the implant, and possible need for additional surgery. They understand and accept and agrees with our planned procedure.   Procedure in detail:  After undergoing routine preop evaluation the patient received prophylactic antibiotics and interscalene block with Exparel was established in the holding area by the anesthesia department.  Patient was transferred to the operating placed supine on the operative table and underwent the smooth induction of the general endotracheal esthesia.  Placed into the beachchair position and appropriately padded and protected.  The right shoulder girdle region was  sterilely prepped and draped in standard fashion.  Timeout was called.  A deltopectoral approach to the right shoulder was made through an approximate centimeter incision.  Skin flaps were elevated dissection carried deeply the deltopectoral interval was developed from proximal to distal with the vein taken laterally.  Conjoined tendon mobilized and retracted medially and adhesions were divided beneath the deltoid.  The long head biceps tendon was tenodesed at the upper border the pectoralis major tendon and the proximal segment was unroofed and excised.  The subscapularis was then elevated from the lesser tuberosity with electrocautery and the rotator cuff was split along the rotator interval to the base of the coracoid and the subscap was intact with a pair of suture tape sutures and reflected medially has the capsular attachments were divided in a subperiosteal fashion from the anterior and inferior margins of the humeral neck.  There was a very large inferior humeral head osteophyte that was carefully dissected free allowing deliver the head through the wound.  An extra medullary guide was then used to outline a proposed humeral head resection which was performed at approximate 20 degrees retroversion and an oscillating saw was used to complete the cut.  The large peripheral osteophytes on the humeral neck were then removed with a rondure.  A metal cap was then placed over the cut proximal humeral surface and we then exposed the glenoid with appropriate retractors.  A circumferential labral resection was then completed.  A guidepin directed into the center of the glenoid with an approximately 5 degree inferior tilt and the glenoid was prepared with the central followed by the peripheral reamer to a stable subchondral bony bed and all soft tissue and cartilage debris was carefully removed.  Glenoid preparation completed with the central  drill and tapped for a 25 mm lag screw.  Baseplate was assembled and  vancomycin powder applied to the threads of the lag screw and the baseplate was then seated with excellent purchase and fixation.  The peripheral locking screws were all then placed using standard technique and obtaining excellent fixation.  A 36/+4 glenosphere was then impacted onto the baseplate and the central locking screw was placed.  We returned attention to the humeral canal which was opened hand reaming and ultimately broaching up to a size 8 at approximately 20 degrees retroversion.  A neutral metaphyseal reaming guide was then placed in the metaphysis was prepared.  A trial implant was placed with trial reduction showing excellent motion good stability good soft tissue balance.  Our trial was then removed and the final implant was assembled.  Vancomycin powder was then sprayed liberally into the humeral canal the final implant was then seated with excellent fit and fixation.  Trial reductions ultimately showed that the +6 polyethylene insert gave [place] the best motion good stability good soft tissue balance.  The trial polywas then removed final polywas then impacted the final reduction was performed showing good motion good stability good soft tissue balance.  At this point we confirmed proper elasticity of the subscapularis and it was subsequently repaired back to the eyelets on the collar of our implant.  The arm then easily achieved 45 degrees of external rotation without excessive tension on the subscap repair.  Final irrigation was then completed.  Hemostasis was obtained.  The balance of the vancomycin powder was then spread liberally throughout the deep soft tissue planes.  The deltopectoral interval was then reapproximated with a series of figure-of-eight number Vicryl sutures.  2-0 Monocryl used to the subcu layer and intracuticular 3-0 Monocryl used for the skin followed by Dermabond and Aquacel dressing.  The right arm was then placed into a sling.  The patient was awakened, extubated, and taken  to the recovery room in stable condition.  Jenetta Loges, PA-C was utilized as an Environmental consultant throughout this case, essential for help with positioning the patient, positioning extremity, tissue manipulation, implantation of the prosthesis, suture management, wound closure, and intraoperative decision-making.  Marin Shutter MD   Contact # [phone]

## 2021-03-04 ENCOUNTER — Encounter: Payer: Self-pay | Admitting: Physician Assistant

## 2021-03-09 ENCOUNTER — Encounter (HOSPITAL_COMMUNITY): Payer: Self-pay | Admitting: Orthopedic Surgery

## 2021-03-13 DIAGNOSIS — Z4789 Encounter for other orthopedic aftercare: Secondary | ICD-10-CM | POA: Diagnosis not present

## 2021-03-27 DIAGNOSIS — M25511 Pain in right shoulder: Secondary | ICD-10-CM | POA: Diagnosis not present

## 2021-04-18 DIAGNOSIS — N1831 Chronic kidney disease, stage 3a: Secondary | ICD-10-CM | POA: Diagnosis not present

## 2021-04-18 DIAGNOSIS — I700 Atherosclerosis of aorta: Secondary | ICD-10-CM | POA: Diagnosis not present

## 2021-04-18 DIAGNOSIS — R7301 Impaired fasting glucose: Secondary | ICD-10-CM | POA: Diagnosis not present

## 2021-04-18 DIAGNOSIS — I10 Essential (primary) hypertension: Secondary | ICD-10-CM | POA: Diagnosis not present

## 2021-05-31 DIAGNOSIS — Z96611 Presence of right artificial shoulder joint: Secondary | ICD-10-CM | POA: Diagnosis not present

## 2021-06-14 DIAGNOSIS — H35372 Puckering of macula, left eye: Secondary | ICD-10-CM | POA: Diagnosis not present

## 2021-06-14 DIAGNOSIS — H25013 Cortical age-related cataract, bilateral: Secondary | ICD-10-CM | POA: Diagnosis not present

## 2021-06-14 DIAGNOSIS — H2513 Age-related nuclear cataract, bilateral: Secondary | ICD-10-CM | POA: Diagnosis not present

## 2021-07-18 DIAGNOSIS — H25812 Combined forms of age-related cataract, left eye: Secondary | ICD-10-CM | POA: Diagnosis not present

## 2021-07-18 DIAGNOSIS — H2512 Age-related nuclear cataract, left eye: Secondary | ICD-10-CM | POA: Diagnosis not present

## 2021-07-18 DIAGNOSIS — H25012 Cortical age-related cataract, left eye: Secondary | ICD-10-CM | POA: Diagnosis not present

## 2021-07-27 DIAGNOSIS — Z23 Encounter for immunization: Secondary | ICD-10-CM | POA: Diagnosis not present

## 2021-08-15 DIAGNOSIS — H25811 Combined forms of age-related cataract, right eye: Secondary | ICD-10-CM | POA: Diagnosis not present

## 2021-08-15 DIAGNOSIS — H25011 Cortical age-related cataract, right eye: Secondary | ICD-10-CM | POA: Diagnosis not present

## 2021-08-15 DIAGNOSIS — H2511 Age-related nuclear cataract, right eye: Secondary | ICD-10-CM | POA: Diagnosis not present

## 2021-09-13 DIAGNOSIS — H5213 Myopia, bilateral: Secondary | ICD-10-CM | POA: Diagnosis not present

## 2021-11-15 DIAGNOSIS — I10 Essential (primary) hypertension: Secondary | ICD-10-CM | POA: Diagnosis not present

## 2021-11-15 DIAGNOSIS — R7301 Impaired fasting glucose: Secondary | ICD-10-CM | POA: Diagnosis not present

## 2021-11-15 DIAGNOSIS — I700 Atherosclerosis of aorta: Secondary | ICD-10-CM | POA: Diagnosis not present

## 2021-11-15 DIAGNOSIS — N1831 Chronic kidney disease, stage 3a: Secondary | ICD-10-CM | POA: Diagnosis not present

## 2022-05-07 DIAGNOSIS — R7301 Impaired fasting glucose: Secondary | ICD-10-CM | POA: Diagnosis not present

## 2022-05-07 DIAGNOSIS — I10 Essential (primary) hypertension: Secondary | ICD-10-CM | POA: Diagnosis not present

## 2022-05-07 DIAGNOSIS — E559 Vitamin D deficiency, unspecified: Secondary | ICD-10-CM | POA: Diagnosis not present

## 2022-05-07 DIAGNOSIS — F419 Anxiety disorder, unspecified: Secondary | ICD-10-CM | POA: Diagnosis not present

## 2022-05-14 DIAGNOSIS — N1831 Chronic kidney disease, stage 3a: Secondary | ICD-10-CM | POA: Diagnosis not present

## 2022-05-14 DIAGNOSIS — R7301 Impaired fasting glucose: Secondary | ICD-10-CM | POA: Diagnosis not present

## 2022-05-14 DIAGNOSIS — Z0000 Encounter for general adult medical examination without abnormal findings: Secondary | ICD-10-CM | POA: Diagnosis not present

## 2022-05-14 DIAGNOSIS — I10 Essential (primary) hypertension: Secondary | ICD-10-CM | POA: Diagnosis not present

## 2022-05-14 DIAGNOSIS — R82998 Other abnormal findings in urine: Secondary | ICD-10-CM | POA: Diagnosis not present

## 2022-10-17 DIAGNOSIS — L821 Other seborrheic keratosis: Secondary | ICD-10-CM | POA: Diagnosis not present

## 2022-10-17 DIAGNOSIS — D225 Melanocytic nevi of trunk: Secondary | ICD-10-CM | POA: Diagnosis not present

## 2022-10-17 DIAGNOSIS — B001 Herpesviral vesicular dermatitis: Secondary | ICD-10-CM | POA: Diagnosis not present

## 2022-10-17 DIAGNOSIS — L814 Other melanin hyperpigmentation: Secondary | ICD-10-CM | POA: Diagnosis not present

## 2022-10-30 DIAGNOSIS — R109 Unspecified abdominal pain: Secondary | ICD-10-CM | POA: Diagnosis not present

## 2022-10-30 DIAGNOSIS — T148XXA Other injury of unspecified body region, initial encounter: Secondary | ICD-10-CM | POA: Diagnosis not present

## 2022-10-30 DIAGNOSIS — N1831 Chronic kidney disease, stage 3a: Secondary | ICD-10-CM | POA: Diagnosis not present

## 2022-10-30 DIAGNOSIS — K5792 Diverticulitis of intestine, part unspecified, without perforation or abscess without bleeding: Secondary | ICD-10-CM | POA: Diagnosis not present

## 2022-12-17 DIAGNOSIS — Z6825 Body mass index (BMI) 25.0-25.9, adult: Secondary | ICD-10-CM | POA: Diagnosis not present

## 2022-12-17 DIAGNOSIS — Z01419 Encounter for gynecological examination (general) (routine) without abnormal findings: Secondary | ICD-10-CM | POA: Diagnosis not present

## 2023-01-28 DIAGNOSIS — K080 Exfoliation of teeth due to systemic causes: Secondary | ICD-10-CM | POA: Diagnosis not present

## 2023-03-29 DIAGNOSIS — H52203 Unspecified astigmatism, bilateral: Secondary | ICD-10-CM | POA: Diagnosis not present

## 2023-05-07 DIAGNOSIS — Z23 Encounter for immunization: Secondary | ICD-10-CM | POA: Diagnosis not present

## 2023-05-28 DIAGNOSIS — N183 Chronic kidney disease, stage 3 unspecified: Secondary | ICD-10-CM | POA: Diagnosis not present

## 2023-05-28 DIAGNOSIS — E785 Hyperlipidemia, unspecified: Secondary | ICD-10-CM | POA: Diagnosis not present

## 2023-05-28 DIAGNOSIS — R7301 Impaired fasting glucose: Secondary | ICD-10-CM | POA: Diagnosis not present

## 2023-05-28 DIAGNOSIS — E559 Vitamin D deficiency, unspecified: Secondary | ICD-10-CM | POA: Diagnosis not present

## 2023-06-04 DIAGNOSIS — R82998 Other abnormal findings in urine: Secondary | ICD-10-CM | POA: Diagnosis not present

## 2023-06-04 DIAGNOSIS — Z1331 Encounter for screening for depression: Secondary | ICD-10-CM | POA: Diagnosis not present

## 2023-06-04 DIAGNOSIS — I129 Hypertensive chronic kidney disease with stage 1 through stage 4 chronic kidney disease, or unspecified chronic kidney disease: Secondary | ICD-10-CM | POA: Diagnosis not present

## 2023-06-04 DIAGNOSIS — I700 Atherosclerosis of aorta: Secondary | ICD-10-CM | POA: Diagnosis not present

## 2023-06-04 DIAGNOSIS — Z0000 Encounter for general adult medical examination without abnormal findings: Secondary | ICD-10-CM | POA: Diagnosis not present

## 2023-06-04 DIAGNOSIS — Z1339 Encounter for screening examination for other mental health and behavioral disorders: Secondary | ICD-10-CM | POA: Diagnosis not present

## 2023-07-10 DIAGNOSIS — H0011 Chalazion right upper eyelid: Secondary | ICD-10-CM | POA: Diagnosis not present

## 2023-08-16 DIAGNOSIS — H524 Presbyopia: Secondary | ICD-10-CM | POA: Diagnosis not present

## 2023-10-23 DIAGNOSIS — L814 Other melanin hyperpigmentation: Secondary | ICD-10-CM | POA: Diagnosis not present

## 2023-10-23 DIAGNOSIS — L570 Actinic keratosis: Secondary | ICD-10-CM | POA: Diagnosis not present

## 2023-10-23 DIAGNOSIS — D225 Melanocytic nevi of trunk: Secondary | ICD-10-CM | POA: Diagnosis not present

## 2023-10-23 DIAGNOSIS — L821 Other seborrheic keratosis: Secondary | ICD-10-CM | POA: Diagnosis not present

## 2023-12-09 DIAGNOSIS — R7301 Impaired fasting glucose: Secondary | ICD-10-CM | POA: Diagnosis not present

## 2023-12-09 DIAGNOSIS — I129 Hypertensive chronic kidney disease with stage 1 through stage 4 chronic kidney disease, or unspecified chronic kidney disease: Secondary | ICD-10-CM | POA: Diagnosis not present

## 2023-12-20 DIAGNOSIS — R195 Other fecal abnormalities: Secondary | ICD-10-CM | POA: Diagnosis not present

## 2023-12-23 DIAGNOSIS — K921 Melena: Secondary | ICD-10-CM | POA: Diagnosis not present

## 2023-12-24 ENCOUNTER — Telehealth: Payer: Self-pay | Admitting: Internal Medicine

## 2023-12-24 NOTE — Telephone Encounter (Signed)
 Patient daughter requesting to speak with a nurse in regards to patient being seen for persistent blood in stool.   Last ov was 2017. Patient has labs done with [facility] done today.   Offered next available o/v with Pa but patient daughter declined due to office availability.   Please advise. Thank you

## 2023-12-24 NOTE — Telephone Encounter (Signed)
 Left message for pt to call back

## 2023-12-25 NOTE — Telephone Encounter (Signed)
 T scheduled to see Everett Hitt NP 12/27/23 @10am . Daughter aware of appt.

## 2023-12-25 NOTE — Telephone Encounter (Signed)
 Patients daughter Babette Bolognese called back requesting a call at [phone]

## 2023-12-27 ENCOUNTER — Ambulatory Visit: Admitting: Nurse Practitioner

## 2023-12-27 ENCOUNTER — Other Ambulatory Visit (INDEPENDENT_AMBULATORY_CARE_PROVIDER_SITE_OTHER)

## 2023-12-27 ENCOUNTER — Encounter: Payer: Self-pay | Admitting: Nurse Practitioner

## 2023-12-27 VITALS — BP 130/70 | HR 60 | Ht 63.0 in | Wt 136.0 lb

## 2023-12-27 DIAGNOSIS — R195 Other fecal abnormalities: Secondary | ICD-10-CM | POA: Diagnosis not present

## 2023-12-27 DIAGNOSIS — K219 Gastro-esophageal reflux disease without esophagitis: Secondary | ICD-10-CM | POA: Diagnosis not present

## 2023-12-27 DIAGNOSIS — Z860101 Personal history of adenomatous and serrated colon polyps: Secondary | ICD-10-CM

## 2023-12-27 DIAGNOSIS — K449 Diaphragmatic hernia without obstruction or gangrene: Secondary | ICD-10-CM

## 2023-12-27 DIAGNOSIS — Z733 Stress, not elsewhere classified: Secondary | ICD-10-CM

## 2023-12-27 LAB — BASIC METABOLIC PANEL WITH GFR
BUN: 20 mg/dL (ref 6–23)
CO2: 31 meq/L (ref 19–32)
Calcium: 9.4 mg/dL (ref 8.4–10.5)
Chloride: 103 meq/L (ref 96–112)
Creatinine, Ser: 0.9 mg/dL (ref 0.40–1.20)
GFR: 57.02 mL/min — ABNORMAL LOW (ref >60.00)
Glucose, Bld: 102 mg/dL — ABNORMAL HIGH (ref 70–99)
Potassium: 4.3 meq/L (ref 3.5–5.1)
Sodium: 140 meq/L (ref 135–145)

## 2023-12-27 MED ORDER — FAMOTIDINE 20 MG PO TABS: 20.0000 mg | ORAL_TABLET | Freq: Every day | ORAL | 1 refills | Status: DC

## 2023-12-27 NOTE — Progress Notes (Addendum)
 12/27/2023 JAYDEN WIGGERS [id number] 1933-03-22   CHIEF COMPLAINT: Dark stools x 1 week  HISTORY OF PRESENT ILLNESS: Arnitra e. Nixdorf is an 89 year old female with a past medical history of anxiety, depression, arthritis, hypertension, hyperlipidemia, pulmonary hypertension, CKD stage IIIa, GERD, Barrett's esophagus, diverticulosis and colon polyps. S/P cholecystectomy 2019. She is known by Dr. Bridgett Camps. She presents to our office today as referred by Dr. Rosslyn Coons for further evaluation regarding heme positive stool cards x 3.  She has a history of GERD with frequent indigestion for which she takes Lansoprazole 30 mg once daily and Pepto-Bismol as needed, she keeps a bottle of Pepto in her fridge. No dysphagia or abdominal pain. She was seen by Dr. Jesse Moritz and she noted having black stools for 1 week.  Black stools were solid, not loose. Laboratory studies were done 12/20/2023 which showed a WBC 6.4.  Hemoglobin 13.8.  Hematocrit 40.8.  MCV 95.4.  Platelet 194.  Stool cards x 3 were heme positive.  She was taking Pepto-Bismol several days prior to the onset of seeing black stools.  No bright red blood per the rectum. She takes Meloxicam a few times monthly.  Her stress level has been elevated mostly due to her husband's health status with recent fall.  She underwent an EGD 03/09/2014 which showed a large hiatal hernia, Barrett's esophagus and multiple fundic gland gastric polyps.  A colonoscopy was done on the same date which identified one 5 mm sessile serrated polyp removed from the proximal transverse colon and mild diverticulosis. Her daughter Babette Bolognese is present and she is caregiver for both parents.  No known family history of gastric or colorectal cancer.   Labs 12/20/2023: WBC 6.4.  Hemoglobin 13.8.  Hematocrit 40.8.  MCV 95.4.  Platelet 194.       Latest Ref Rng & Units 02/22/2021   11:31 AM 08/09/2020   10:05 AM 07/27/2018   10:28 AM  CBC  WBC 4.0 - 10.5 K/uL 4.2  7.3  6.8    Hemoglobin 12.0 - 15.0 g/dL 11.9  14.7  82.9   Hematocrit 36.0 - 46.0 % 41.0  42.8  37.2   Platelets 150 - 400 K/uL 174  199  186        Latest Ref Rng & Units 02/22/2021   11:31 AM 08/09/2020   10:05 AM 07/27/2018   10:28 AM  CMP  Glucose 70 - 99 mg/dL 562  130  865   BUN 8 - 23 mg/dL 17  19  12    Creatinine 0.44 - 1.00 mg/dL 7.84  6.96  2.95   Sodium 135 - 145 mmol/L 139  137  134   Potassium 3.5 - 5.1 mmol/L 4.6  4.1  4.0   Chloride 98 - 111 mmol/L 105  100  97   CO2 22 - 32 mmol/L 30  28  27    Calcium 8.9 - 10.3 mg/dL 9.2  9.4  8.7   Total Protein 6.5 - 8.1 g/dL   6.3   Total Bilirubin 0.3 - 1.2 mg/dL   0.4   Alkaline Phos 38 - 126 U/L   110   AST 15 - 41 U/L   57   ALT 0 - 44 U/L   59     PAST GI PROCEDURES:  EGD 03/09/2014: 1. There was evidence of suspected Barrett's esophagus in the lower esophagus (2-3 cm segment); multiple biopsies  2. Large hiatal hernia  3. Multiple semi-pedunculated polyps ranging between  3-8 mm in size were found in the cardia, gastric fundus, and gastric body  4. The duodenal mucosa showed no abnormalities in the bulb and second portion of the duodenum  Colonoscopy 03/09/2014: 1. Sessile polyp measuring 5 mm in size was found in the proximal transverse colon; polypectomy was performed with a cold snare  2. Mild diverticulosis was noted in the sigmoid colon  3. The colon mucosa was otherwise normal  1. Surgical [P], gastric, biopsy - FUNDIC GLAND POLYP(S) (MULTIPLE FRAGMENTS). - THERE IS NO EVIDENCE OF HELICOBACTER PYLORI, GOBLET CELL METAPLASIA, DYSPLASIA OR MALIGNANCY. - SEE COMMENT. 2. Surgical [P], GE junction, biopsy - INTESTINAL METAPLASIA (GOBLET CELL METAPLASIA) CONSISTENT WITH BARRETT'S ESOPHAGUS. - THERE IS NO EVIDENCE OF DYSPLASIA OR MALIGNANCY. 3. Surgical [P], transverse, polyp - SESSILE SERRATED POLYP WITHOUT CYTOLOGIC DYSPLASIA   Past Medical History:  Diagnosis Date   Anxiety    Arthritis    Barrett's esophagus     Colon polyps    Depression    Diverticulitis    Diverticulosis    GERD (gastroesophageal reflux disease)    Hepatic steatosis    Hiatal hernia    Hyperlipidemia    Hypertension    Ovarian cyst    Pulmonary hypertension (HCC)    Past Surgical History:  Procedure Laterality Date   BREAST SURGERY     for benign tumor   CARPAL TUNNEL RELEASE Right 1990   CHOLECYSTECTOMY N/A 07/24/2018   Procedure: LAPAROSCOPIC CHOLECYSTECTOMY;  Surgeon: Jerryl Morin, MD;  Location: [facility] OR;  Service: General;  Laterality: N/A;   REVERSE SHOULDER ARTHROPLASTY Left 08/18/2020   Procedure: REVERSE SHOULDER ARTHROPLASTY;  Surgeon: Ellard Gunning, MD;  Location: [facility];  Service: Orthopedics;  Laterality: Left;    REVERSE SHOULDER ARTHROPLASTY Right 03/03/2021   Procedure: REVERSE SHOULDER ARTHROPLASTY;  Surgeon: Ellard Gunning, MD;  Location: [facility];  Service: Orthopedics;  Laterality: Right;   Social History:  She reports that she has never smoked. She has never used smokeless tobacco. She reports that she does not drink alcohol  and does not use drugs.  Family History: family history includes CAD in her father; COPD in her mother; Heart disease in her maternal aunt and paternal grandfather; Liver cancer in her mother.  No known family history of esophageal, gastric or colon cancer.  No Known Allergies   Outpatient Encounter Medications as of 12/27/2023  Medication Sig   acetaminophen  (TYLENOL ) 325 MG tablet Take 2 tablets (650 mg total) by mouth every 6 (six) hours as needed.   Biotin w/ Vitamins C & E (HAIR SKIN & NAILS GUMMIES PO) Take 2 capsules by mouth daily.   clonazePAM  (KLONOPIN ) 0.5 MG tablet Take 0.5 mg by mouth 3 (three) times daily as needed for anxiety.    COLLAGEN PO Take 1 Scoop by mouth daily.   dextromethorphan-guaiFENesin (MUCINEX DM) 30-600 MG 12hr tablet Take 1 tablet by mouth 2 (two) times daily. As needed   ergocalciferol (VITAMIN D2) 50000 units capsule Take 50,000 Units by mouth  every Friday.    lansoprazole (PREVACID) 30 MG capsule Take 30 mg by mouth daily.   losartan  (COZAAR ) 100 MG tablet Take 100 mg by mouth daily.    meloxicam (MOBIC) 7.5 MG tablet Take 7.5 mg by mouth daily as needed for pain.   methocarbamol  (ROBAXIN ) 500 MG tablet Take 1 tablet (500 mg total) by mouth every 8 (eight) hours as needed for muscle spasms.   rosuvastatin (CRESTOR) 10 MG tablet Take 10 mg by mouth 2 (  two) times a week. Monday and Friday   No facility-administered encounter medications on file as of 12/27/2023.   REVIEW OF SYSTEMS:  Gen: Denies fever, sweats or chills. No weight loss.  CV: Denies chest pain, palpitations or edema. Resp: Denies cough, shortness of breath of hemoptysis.  GI: See HPI.  GU: Denies urinary burning, blood in urine, increased urinary frequency or incontinence. MS: Denies joint pain, muscles aches or weakness. Derm: Denies rash, itchiness, skin lesions or unhealing ulcers. Psych:+ Anxiety. Heme: Denies bruising, easy bleeding. Neuro:  Denies headaches, dizziness or paresthesias. Endo:  Denies any problems with DM, thyroid  or adrenal function.  PHYSICAL EXAM: BP 130/70   Pulse 60   Ht 5\' 3"  (1.6 m)   Wt 136 lb (61.7 kg)   BMI 24.09 kg/m   General: 89 year old female in no acute distress. Head: Normocephalic and atraumatic. Eyes:  Sclerae non-icteric, conjunctive pink. Ears: Normal auditory acuity. Mouth: Dentition intact. No ulcers or lesions.  Neck: Supple, no lymphadenopathy or thyromegaly.  Lungs: Clear bilaterally to auscultation without wheezes, crackles or rhonchi. Heart: Regular rate and rhythm. No murmur, rub or gallop appreciated.  Abdomen: Soft, nontender, nondistended. No masses. No hepatosplenomegaly. Normoactive bowel sounds x 4 quadrants.  Rectal: Deferred. Musculoskeletal: Symmetrical with no gross deformities. Skin: Warm and dry. No rash or lesions on visible extremities. Extremities: No edema. Neurological: Alert oriented x  4, no focal deficits.  Psychological:  Alert and cooperative. Normal mood and affect.  ASSESSMENT AND PLAN:  89 year old female with a history of GERD, Barrett's esophagus and a large hiatal hernia presents for further evaluation regarding black stools x 1 week in the setting of taking Pepto-Bismol for indigestion symptoms.  Stool cards heme positive x 3.  Patient is potentially at risk for upper GI bleeding/Cameron lesions secondary to a large hiatal hernia and for PUD secondary to elevated stress level.  -No Pepto-Bismol - Continue Pantoprazole  40 mg daily to be taken 30 minutes before breakfast - Famotidine  20 mg nightly - Gaviscon 1 tablespoon p.o. 3 times daily as needed - CBC and BMP  - EGD benefits and risks discussed including risk with sedation, risk of bleeding, perforation and infection.  I discussed with the patient and her daughter possible EGD if repeat labs show a decline in her hemoglobin level or if she has recurrent black stools.  I discussed she is at a higher risk for sedation and procedure complications secondary to age.  Dr. Bridgett Camps to review case further to verify if an EGD to be pursued at this time. - Patient to contact office if black stools recur off Pepto-Bismol  History of a sessile serrated colon polyp per colonoscopy 02/2014 - No further colon polyp surveillance colonoscopies recommended due to age  Anxiety   CC:  Rosslyn Coons, MD  Addendum: Chart reviewed Labs reviewed Hemoglobin normal at 13.5   Black stools have resolved off Pepto-Bismol Stools were noted to be heme positive   Patient with a history of Barrett's esophagus, large hiatal hernia, benign fundic gland polyps and subcentimeter sessile serrated polyp of the colon plus diverticulosis   In the absence of overt bleeding, the risks of upper endoscopy and colonoscopy currently are felt to outweigh the benefit She should continue PPI therapy If she has melena, visible blood in stool then we will  rediscuss our endoscopic recommendations and may consider cross-sectional imaging. EGD reserved for evidence of bleeding or troublesome upper GI symptoms such as dysphagia or worsening abdominal pain/nausea vomiting.

## 2023-12-27 NOTE — Patient Instructions (Addendum)
 Your provider has requested that you go to the basement level for lab work before leaving today. Press "B" on the elevator. The lab is located at the first door on the left as you exit the elevator.  Stop Pepto Bismol  Continue Lansoprazole 30 mg daily  Gaviscon liquid- take 1 tablespoon 3 times daily for indigestion  Contact our office if black stools recur.  Due to recent changes in healthcare laws, you may see the results of your imaging and laboratory studies on MyChart before your provider has had a chance to review them.  We understand that in some cases there may be results that are confusing or concerning to you. Not all laboratory results come back in the same time frame and the provider may be waiting for multiple results in order to interpret others.  Please give us  48 hours in order for your provider to thoroughly review all the results before contacting the office for clarification of your results.   Thank you for trusting me with your gastrointestinal care!   Everett Hitt, CRNP

## 2023-12-28 LAB — CBC
HCT: 40.9 % (ref 36.0–46.0)
Hemoglobin: 13.6 g/dL (ref 12.0–15.0)
MCHC: 33.2 g/dL (ref 30.0–36.0)
MCV: 96.3 fl (ref 78.0–100.0)
Platelets: 179 10*3/uL (ref 150.0–400.0)
RBC: 4.25 Mil/uL (ref 3.87–5.11)
RDW: 12.6 % (ref 11.5–15.5)
WBC: 4.4 10*3/uL (ref 4.0–10.5)

## 2024-01-06 NOTE — Telephone Encounter (Signed)
 Chart reviewed Labs reviewed Hemoglobin normal at 13.5  Black stools have resolved off Pepto-Bismol Stools were noted to be heme positive  Patient with a history of Barrett's esophagus, large hiatal hernia, benign fundic gland polyps and subcentimeter sessile serrated polyp of the colon plus diverticulosis  In the absence of overt bleeding, the risks of upper endoscopy and colonoscopy currently are felt to outweigh the benefit She should continue PPI therapy If she has melena, visible blood in stool then we will rediscuss our endoscopic recommendations and may consider cross-sectional imaging. EGD reserved for evidence of bleeding or troublesome upper GI symptoms such as dysphagia or worsening abdominal pain/nausea vomiting.

## 2024-02-14 ENCOUNTER — Other Ambulatory Visit: Payer: Self-pay | Admitting: Nurse Practitioner

## 2024-04-09 ENCOUNTER — Other Ambulatory Visit: Payer: Self-pay | Admitting: Nurse Practitioner

## 2024-05-29 DIAGNOSIS — E559 Vitamin D deficiency, unspecified: Secondary | ICD-10-CM | POA: Diagnosis not present

## 2024-05-29 DIAGNOSIS — I129 Hypertensive chronic kidney disease with stage 1 through stage 4 chronic kidney disease, or unspecified chronic kidney disease: Secondary | ICD-10-CM | POA: Diagnosis not present

## 2024-05-29 DIAGNOSIS — R7301 Impaired fasting glucose: Secondary | ICD-10-CM | POA: Diagnosis not present

## 2024-06-05 DIAGNOSIS — Z1339 Encounter for screening examination for other mental health and behavioral disorders: Secondary | ICD-10-CM | POA: Diagnosis not present

## 2024-06-05 DIAGNOSIS — I10 Essential (primary) hypertension: Secondary | ICD-10-CM | POA: Diagnosis not present

## 2024-06-05 DIAGNOSIS — Z1331 Encounter for screening for depression: Secondary | ICD-10-CM | POA: Diagnosis not present

## 2024-06-05 DIAGNOSIS — Z0000 Encounter for general adult medical examination without abnormal findings: Secondary | ICD-10-CM | POA: Diagnosis not present

## 2024-06-05 DIAGNOSIS — R82998 Other abnormal findings in urine: Secondary | ICD-10-CM | POA: Diagnosis not present

## 2024-06-05 DIAGNOSIS — I129 Hypertensive chronic kidney disease with stage 1 through stage 4 chronic kidney disease, or unspecified chronic kidney disease: Secondary | ICD-10-CM | POA: Diagnosis not present

## 2024-06-10 ENCOUNTER — Other Ambulatory Visit: Payer: Self-pay | Admitting: Nurse Practitioner

## 2024-08-09 ENCOUNTER — Other Ambulatory Visit: Payer: Self-pay | Admitting: Nurse Practitioner
